# Patient Record
Sex: Male | Born: 1985 | Race: Asian | Hispanic: No | Marital: Married | Smoking: Never smoker
Health system: Southern US, Community
[De-identification: ages and names within clinical notes are randomized; demographics above are authoritative.]

## PROBLEM LIST (undated history)

## (undated) DIAGNOSIS — K219 Gastro-esophageal reflux disease without esophagitis: Secondary | ICD-10-CM

## (undated) DIAGNOSIS — E119 Type 2 diabetes mellitus without complications: Secondary | ICD-10-CM

## (undated) DIAGNOSIS — B1910 Unspecified viral hepatitis B without hepatic coma: Secondary | ICD-10-CM

## (undated) DIAGNOSIS — F32A Depression, unspecified: Secondary | ICD-10-CM

## (undated) DIAGNOSIS — E785 Hyperlipidemia, unspecified: Secondary | ICD-10-CM

## (undated) DIAGNOSIS — N529 Male erectile dysfunction, unspecified: Secondary | ICD-10-CM

## (undated) DIAGNOSIS — G629 Polyneuropathy, unspecified: Secondary | ICD-10-CM

## (undated) HISTORY — DX: Unspecified viral hepatitis B without hepatic coma: B19.10

## (undated) HISTORY — DX: Hyperlipidemia, unspecified: E78.5

## (undated) HISTORY — DX: Polyneuropathy, unspecified: G62.9

## (undated) HISTORY — DX: Depression, unspecified: F32.A

## (undated) HISTORY — DX: Gastro-esophageal reflux disease without esophagitis: K21.9

## (undated) HISTORY — DX: Male erectile dysfunction, unspecified: N52.9

---

## 2010-08-08 ENCOUNTER — Emergency Department (HOSPITAL_COMMUNITY)
Admission: EM | Admit: 2010-08-08 | Discharge: 2010-08-08 | Payer: Self-pay | Source: Home / Self Care | Admitting: Family Medicine

## 2010-08-09 LAB — POCT URINALYSIS DIPSTICK
Bilirubin Urine: NEGATIVE
Nitrite: NEGATIVE
Specific Gravity, Urine: <=1.005 (ref 1.005–1.030)
Urine Glucose, Fasting: 500 mg/dL — AB

## 2010-08-09 LAB — POCT I-STAT, CHEM 8
Chloride: 99 mEq/L (ref 96–112)
Glucose, Bld: 470 mg/dL — ABNORMAL HIGH (ref 70–99)
HCT: 53 % — ABNORMAL HIGH (ref 39.0–52.0)
Potassium: 4.1 mEq/L (ref 3.5–5.1)
Sodium: 135 mEq/L (ref 135–145)

## 2013-01-27 ENCOUNTER — Emergency Department (INDEPENDENT_AMBULATORY_CARE_PROVIDER_SITE_OTHER)
Admission: EM | Admit: 2013-01-27 | Discharge: 2013-01-27 | Disposition: A | Discharge status: Home / Self Care | Payer: Self-pay | Source: Home / Self Care

## 2013-01-27 ENCOUNTER — Encounter (HOSPITAL_COMMUNITY): Payer: Self-pay | Admitting: Emergency Medicine

## 2013-01-27 DIAGNOSIS — E871 Hypo-osmolality and hyponatremia: Secondary | ICD-10-CM

## 2013-01-27 DIAGNOSIS — R7309 Other abnormal glucose: Secondary | ICD-10-CM

## 2013-01-27 DIAGNOSIS — E119 Type 2 diabetes mellitus without complications: Secondary | ICD-10-CM

## 2013-01-27 DIAGNOSIS — E1142 Type 2 diabetes mellitus with diabetic polyneuropathy: Secondary | ICD-10-CM

## 2013-01-27 DIAGNOSIS — R739 Hyperglycemia, unspecified: Secondary | ICD-10-CM

## 2013-01-27 DIAGNOSIS — E1149 Type 2 diabetes mellitus with other diabetic neurological complication: Secondary | ICD-10-CM

## 2013-01-27 HISTORY — DX: Type 2 diabetes mellitus without complications: E11.9

## 2013-01-27 LAB — POCT I-STAT, CHEM 8
BUN: 11 mg/dL (ref 6–23)
Calcium, Ion: 1.2 mmol/L (ref 1.12–1.23)
Chloride: 95 mEq/L — ABNORMAL LOW (ref 96–112)
HCT: 54 % — ABNORMAL HIGH (ref 39.0–52.0)
Potassium: 4.1 mEq/L (ref 3.5–5.1)

## 2013-01-27 NOTE — ED Notes (Signed)
Pt c/o bilateral feet burning onset >1 month... Reports he is a diabetic and has not had DM meds >1 year since [facility] closed... Reports he was taking metformin and another med for DM but does not remember the name... He is alert w/no signs of acute distress.

## 2013-01-27 NOTE — ED Notes (Signed)
Reports he has been managing his DM w/diet and exercise.

## 2013-01-27 NOTE — ED Provider Notes (Signed)
History    CSN: [id number] Arrival date & time 01/27/13  1304  First MD Initiated Contact with Patient 01/27/13 1358     Chief Complaint  Patient presents with  . Foot Burn   (Consider location/radiation/quality/duration/timing/severity/associated sxs/prior Treatment) HPI Comments: 27 year old Hispanic male is a former patient of [facility] who presents with burning type pain in his feet over a month. He also has pain in his thighs and calves. He was diagnosed with diabetes a few years ago and was being treated with a medication from [facility]. He is now complaining of polyuria and polydipsia. He has been out of his diabetes medication for one year.  Past Medical History  Diagnosis Date  . Diabetes mellitus without complication    History reviewed. No pertinent past surgical history. No family history on file. History  Substance Use Topics  . Smoking status: Never Smoker   . Smokeless tobacco: Not on file  . Alcohol Use: No    Review of Systems  Constitutional: Positive for appetite change and fatigue. Negative for fever.  HENT: Negative.   Respiratory: Negative.   Cardiovascular: Negative.   Gastrointestinal: Negative.   Endocrine: Positive for polydipsia and polyuria.  Genitourinary: Negative for dysuria.  Neurological: Negative.     Allergies  Review of patient's allergies indicates no known allergies.  Home Medications  No current outpatient prescriptions on file. BP 134/82  Pulse 74  Temp(Src) 99 F (37.2 C) (Oral)  Resp 18  SpO2 98% Physical Exam  Nursing note and vitals reviewed. Constitutional: He appears well-developed and well-nourished. No distress.  Eyes: Conjunctivae and EOM are normal.  Neck: Normal range of motion. Neck supple.  Cardiovascular: Normal rate and normal heart sounds.   Pulmonary/Chest: Effort normal and breath sounds normal.  Abdominal: Soft. Bowel sounds are normal.  Musculoskeletal: Normal range of motion. He exhibits no  edema and no tenderness.  Lymphadenopathy:    He has no cervical adenopathy.  Neurological: He is alert. He exhibits normal muscle tone.  Skin: Skin is warm. No erythema.    ED Course  Procedures (including critical care time) Labs Reviewed  POCT I-STAT, CHEM 8 - Abnormal; Notable for the following:    Sodium 133 (*)    Chloride 95 (*)    Glucose, Bld 486 (*)    Hemoglobin 18.4 (*)    HCT 54.0 (*)    All other components within normal limits   No results found. 1. T2DM (type 2 diabetes mellitus)   2. Hyperglycemia   3. Dehydration with hyponatremia   4. Diabetic peripheral neuropathy associated with type 2 diabetes mellitus     MDM  Due to the patient's mild hyponatremia and mild dehydration with hyperglycemia and no medications for glucose control the patient will be transferred via shuttle to the emergency department for IVF and insulin. After which, the patient can be referred to the [facility].   Hayden Rasmussen, NP 01/27/13 1521 The patient was advised of his condition in necessity to obtain IV fluids and insulin to lower his blood sugar and at that point a decision can be made for temporary medications for glycemic control. The patient states that he could not go now that he needed to take his child to his mother's house and then he needed to go to a meeting for Medicaid at 4:00. Therefore, the patient elected not to go to the emergency department now in favor of taking care of his business. He states he would go there  later tonight. He was advised that he may get worse in the meantime.  Hayden Rasmussen, NP 01/27/13 [phone]

## 2013-01-27 NOTE — ED Notes (Signed)
Pt reports he could not go to ER now b/c he had to drop off his son and had appt at [facility] to renew his childs medicaid.... Hayden Rasmussen, NP is aware... Adv pt that he needs to to ER today after he finishes but it's very important he do so immediately... Pt verbalized understanding and states he would go around 2200 tonight.

## 2013-01-27 NOTE — ED Provider Notes (Signed)
Medical screening examination/treatment/procedure(s) were performed by resident physician or non-physician practitioner and as supervising physician I was immediately available for consultation/collaboration.   KINDL,JAMES DOUGLAS MD.   James D Kindl, MD 01/27/13 1748 

## 2013-01-28 ENCOUNTER — Encounter (HOSPITAL_COMMUNITY): Payer: Self-pay | Admitting: Emergency Medicine

## 2013-01-28 ENCOUNTER — Emergency Department (HOSPITAL_COMMUNITY)
Admission: EM | Admit: 2013-01-28 | Discharge: 2013-01-28 | Disposition: A | Discharge status: Home / Self Care | Payer: Self-pay | Attending: Emergency Medicine | Admitting: Emergency Medicine

## 2013-01-28 DIAGNOSIS — E860 Dehydration: Secondary | ICD-10-CM | POA: Insufficient documentation

## 2013-01-28 DIAGNOSIS — R7309 Other abnormal glucose: Secondary | ICD-10-CM | POA: Insufficient documentation

## 2013-01-28 DIAGNOSIS — R358 Other polyuria: Secondary | ICD-10-CM | POA: Insufficient documentation

## 2013-01-28 DIAGNOSIS — IMO0001 Reserved for inherently not codable concepts without codable children: Secondary | ICD-10-CM | POA: Insufficient documentation

## 2013-01-28 DIAGNOSIS — R3589 Other polyuria: Secondary | ICD-10-CM | POA: Insufficient documentation

## 2013-01-28 DIAGNOSIS — R631 Polydipsia: Secondary | ICD-10-CM | POA: Insufficient documentation

## 2013-01-28 DIAGNOSIS — R739 Hyperglycemia, unspecified: Secondary | ICD-10-CM

## 2013-01-28 LAB — COMPREHENSIVE METABOLIC PANEL
Alkaline Phosphatase: 85 U/L (ref 39–117)
BUN: 13 mg/dL (ref 6–23)
CO2: 25 mEq/L (ref 19–32)
Chloride: 94 mEq/L — ABNORMAL LOW (ref 96–112)
Creatinine, Ser: 0.52 mg/dL (ref 0.50–1.35)
GFR calc non Af Amer: >90 mL/min (ref >90)
Glucose, Bld: 389 mg/dL — ABNORMAL HIGH (ref 70–99)
Potassium: 3.8 mEq/L (ref 3.5–5.1)
Total Bilirubin: 1 mg/dL (ref 0.3–1.2)

## 2013-01-28 LAB — GLUCOSE, CAPILLARY
Glucose-Capillary: 377 mg/dL — ABNORMAL HIGH (ref 70–99)
Glucose-Capillary: 327 mg/dL — ABNORMAL HIGH (ref 70–99)

## 2013-01-28 LAB — URINALYSIS, ROUTINE W REFLEX MICROSCOPIC
Glucose, UA: >1000 mg/dL — AB
Ketones, ur: NEGATIVE mg/dL
Leukocytes, UA: NEGATIVE
Protein, ur: NEGATIVE mg/dL
Urobilinogen, UA: 0.2 mg/dL (ref 0.0–1.0)

## 2013-01-28 LAB — CBC
HCT: 43.6 % (ref 39.0–52.0)
Hemoglobin: 15.6 g/dL (ref 13.0–17.0)
MCV: 83.5 fL (ref 78.0–100.0)
RBC: 5.22 MIL/uL (ref 4.22–5.81)
WBC: 5.8 10*3/uL (ref 4.0–10.5)

## 2013-01-28 MED ORDER — SODIUM CHLORIDE 0.9 % IV BOLUS (SEPSIS)
1000.0000 mL | Freq: Once | INTRAVENOUS | Status: AC
  Administered 2013-01-28: 1000 mL via INTRAVENOUS

## 2013-01-28 MED ORDER — METFORMIN HCL 1000 MG PO TABS: 1000.0000 mg | ORAL_TABLET | Freq: Two times a day (BID) | ORAL | Status: DC

## 2013-01-28 MED ORDER — GLIPIZIDE 10 MG PO TABS: 5.0000 mg | ORAL_TABLET | Freq: Two times a day (BID) | ORAL | Status: DC

## 2013-01-28 NOTE — ED Notes (Signed)
Dr Manly at bedside 

## 2013-01-28 NOTE — ED Notes (Signed)
PT. SEEN AT  [facility] TODAY , REPORTS ELEVATED BLOOD SUGAR THIS AFTERNOON  = 486 , BLOOD TEST DONE AT URGENT CARE . PT. STATED THIRST , BILATERAL FEET / LOWER LEG TINGLING " BURNING" , PT. STATED HE HAS NO MEDICATIONS FOR HIS DM FOR SEVERAL MONTHS . RESPIRATIONS UNLABORED, DENIES NAUSEA OR VOMITTING .

## 2013-01-28 NOTE — ED Provider Notes (Signed)
History    CSN: [id number] Arrival date & time 01/28/13  0018  First MD Initiated Contact with Patient 01/28/13 [phone]     Chief Complaint  Patient presents with  . Hyperglycemia   (Consider location/radiation/quality/duration/timing/severity/associated sxs/prior Treatment) HPI Patient is a 27 yo man with NIDDM who has been off all meds for a year. He presents on referral from UC where he was found to have a BG in the 400s.   Patient has had polyuria, polydypsia and burning in his feet bilaterally. No vomiting. No visual changes. Says he was receiving care at [facility] but has had no follow up since the office closed. Thus,  He has not been on meds. He had been taking Metformin and "another medication" which he can't recall the name of.   He does not receive primary care.  Past Medical History  Diagnosis Date  . Diabetes mellitus without complication    History reviewed. No pertinent past surgical history. No family history on file. History  Substance Use Topics  . Smoking status: Never Smoker   . Smokeless tobacco: Not on file  . Alcohol Use: Yes    Review of Systems Gen: no weight loss, fevers, chills, night sweats Eyes: no discharge or drainage, no occular pain or visual changes Nose: no epistaxis or rhinorrhea Mouth: no dental pain, no sore throat Neck: no neck pain Lungs: no SOB, cough, wheezing CV: no chest pain, palpitations, dependent edema or orthopnea Abd: no abdominal pain, nausea, vomiting GU: no dysuria or gross hematuria MSK: no myalgias or arthralgias Neuro: no headache, no focal neurologic deficits Skin: no rash Endo: as per hpi, otherwise negative Psyche: negative.  Allergies  Review of patient's allergies indicates no known allergies.  Home Medications  No current outpatient prescriptions on file. BP 109/64  Pulse 64  Temp(Src) 98 F (36.7 C) (Oral)  Resp 18  Ht 5\' 8"  (1.727 m)  Wt 170 lb (77.111 kg)  BMI 25.85 kg/m2  SpO2 99% Physical  Exam Gen: well developed and well nourished appearing Head: NCAT Eyes: PERL, EOMI Nose: no epistaixis or rhinorrhea Mouth/throat: mucosa is mildly dehydrated appearing and pink Neck: supple, no stridor Lungs: CTA B, no wheezing, rhonchi or rales Heart-regular rate and rhythm, no murmur, chemical perfused, cap refill less than 2 seconds Abd: soft, notender, nondistended Back: no ttp, no cva ttp Skin: no rashese, wnl Neuro: CN ii-xii grossly intact, no focal deficits Psyche; normal affect,  calm and cooperative.   ED Course  Procedures (including critical care time) Results for orders placed during the hospital encounter of 01/28/13 (from the past 24 hour(s))  CBC     Status: None   Collection Time    01/28/13  1:30 AM      Result Value Range   WBC 5.8  4.0 - 10.5 K/uL   RBC 5.22  4.22 - 5.81 MIL/uL   Hemoglobin 15.6  13.0 - 17.0 g/dL   HCT 11.9  14.7 - 82.9 %   MCV 83.5  78.0 - 100.0 fL   MCH 29.9  26.0 - 34.0 pg   MCHC 35.8  30.0 - 36.0 g/dL   RDW 56.2  13.0 - 86.5 %   Platelets 160  150 - 400 K/uL  COMPREHENSIVE METABOLIC PANEL     Status: Abnormal   Collection Time    01/28/13  1:30 AM      Result Value Range   Sodium 127 (*) 135 - 145 mEq/L   Potassium 3.8  3.5 -  5.1 mEq/L   Chloride 94 (*) 96 - 112 mEq/L   CO2 25  19 - 32 mEq/L   Glucose, Bld 389 (*) 70 - 99 mg/dL   BUN 13  6 - 23 mg/dL   Creatinine, Ser 1.61  0.50 - 1.35 mg/dL   Calcium 8.9  8.4 - 09.6 mg/dL   Total Protein 6.6  6.0 - 8.3 g/dL   Albumin 3.5  3.5 - 5.2 g/dL   AST 28  0 - 37 U/L   ALT 92 (*) 0 - 53 U/L   Alkaline Phosphatase 85  39 - 117 U/L   Total Bilirubin 1.0  0.3 - 1.2 mg/dL   GFR calc non Af Amer >90  >90 mL/min   GFR calc Af Amer >90  >90 mL/min  GLUCOSE, CAPILLARY     Status: Abnormal   Collection Time    01/28/13  1:51 AM      Result Value Range   Glucose-Capillary 377 (*) 70 - 99 mg/dL     MDM  Patient with uncontrolled NIDDM presents with nonketotic hyperglycemia and  dehydration. We are treating with IVF. Anticipate that the patient will be able to be discharged with plan to restart Metformin and Glipizide. Patient had been followed at [facility] before the clinic closed and will be referred to the [facility].   Brandt Loosen, MD 01/28/13 [phone]

## 2013-01-28 NOTE — ED Notes (Signed)
Pt states he has diabetes and went to urgent care earlier today regarding his blood sugar. Pt states he has been out of medication over a year and his feet have been burning for about 5-6 months. Urgent care sent him here for further evaluation. CBG at urgent care 486. Pt able to wiggle toes, cap refill <3 secs, pedal pulses strong.

## 2013-01-28 NOTE — ED Notes (Signed)
Pt comfortable with d/c and f/u instructions. Prescriptions x2. 

## 2013-10-01 ENCOUNTER — Ambulatory Visit (INDEPENDENT_AMBULATORY_CARE_PROVIDER_SITE_OTHER): Payer: No Typology Code available for payment source | Admitting: Medical

## 2013-10-01 ENCOUNTER — Encounter: Payer: Self-pay | Admitting: Medical

## 2013-10-01 VITALS — BP 118/70 | HR 78 | Temp 97.7°F | Resp 16 | Ht 67.75 in | Wt 166.0 lb

## 2013-10-01 DIAGNOSIS — IMO0001 Reserved for inherently not codable concepts without codable children: Secondary | ICD-10-CM

## 2013-10-01 DIAGNOSIS — R3589 Other polyuria: Secondary | ICD-10-CM

## 2013-10-01 DIAGNOSIS — R631 Polydipsia: Secondary | ICD-10-CM

## 2013-10-01 DIAGNOSIS — R202 Paresthesia of skin: Secondary | ICD-10-CM

## 2013-10-01 DIAGNOSIS — E1165 Type 2 diabetes mellitus with hyperglycemia: Principal | ICD-10-CM

## 2013-10-01 DIAGNOSIS — R209 Unspecified disturbances of skin sensation: Secondary | ICD-10-CM

## 2013-10-01 DIAGNOSIS — R358 Other polyuria: Secondary | ICD-10-CM

## 2013-10-01 LAB — POCT URINALYSIS DIPSTICK
BILIRUBIN UA: NEGATIVE
GLUCOSE UA: 250
KETONES UA: NEGATIVE
Leukocytes, UA: NEGATIVE
Nitrite, UA: NEGATIVE
Protein, UA: NEGATIVE
RBC UA: NEGATIVE
SPEC GRAV UA: 1.015
Urobilinogen, UA: NEGATIVE
pH, UA: 5

## 2013-10-01 LAB — CBC
HCT: 49.6 % (ref 39.0–52.0)
Hemoglobin: 17.3 g/dL — ABNORMAL HIGH (ref 13.0–17.0)
MCH: 30.3 pg (ref 26.0–34.0)
MCHC: 34.9 g/dL (ref 30.0–36.0)
MCV: 86.9 fL (ref 78.0–100.0)
PLATELETS: 181 10*3/uL (ref 150–400)
RBC: 5.71 MIL/uL (ref 4.22–5.81)
RDW: 13.6 % (ref 11.5–15.5)
WBC: 6 10*3/uL (ref 4.0–10.5)

## 2013-10-01 LAB — COMPREHENSIVE METABOLIC PANEL
ALT: 66 U/L — AB (ref 0–53)
AST: 26 U/L (ref 0–37)
Albumin: 4.6 g/dL (ref 3.5–5.2)
Alkaline Phosphatase: 87 U/L (ref 39–117)
BILIRUBIN TOTAL: 0.8 mg/dL (ref 0.2–1.2)
BUN: 10 mg/dL (ref 6–23)
CO2: 26 meq/L (ref 19–32)
CREATININE: 0.68 mg/dL (ref 0.50–1.35)
Calcium: 9.4 mg/dL (ref 8.4–10.5)
Chloride: 96 mEq/L (ref 96–112)
Glucose, Bld: 388 mg/dL — ABNORMAL HIGH (ref 70–99)
Potassium: 4.9 mEq/L (ref 3.5–5.3)
Sodium: 128 mEq/L — ABNORMAL LOW (ref 135–145)
Total Protein: 7.6 g/dL (ref 6.0–8.3)

## 2013-10-01 LAB — LIPID PANEL
CHOL/HDL RATIO: 5.3 ratio
CHOLESTEROL: 203 mg/dL — AB (ref 0–200)
HDL: 38 mg/dL — ABNORMAL LOW (ref >39)
LDL Cholesterol: 110 mg/dL — ABNORMAL HIGH (ref 0–99)
TRIGLYCERIDES: 274 mg/dL — AB (ref <150)
VLDL: 55 mg/dL — AB (ref 0–40)

## 2013-10-01 LAB — HEMOGLOBIN A1C
Hgb A1c MFr Bld: 14.1 % — ABNORMAL HIGH (ref <5.7)
Mean Plasma Glucose: 358 mg/dL — ABNORMAL HIGH (ref <117)

## 2013-10-01 LAB — GLUCOSE, POCT (MANUAL RESULT ENTRY): POC Glucose: 322 mg/dl — AB (ref 70–99)

## 2013-10-01 NOTE — Patient Instructions (Signed)
  Thank you for giving me the opportunity to serve you today.    Your diagnosis today includes: Encounter Diagnoses  Name Primary?  . Type II or unspecified type diabetes mellitus without mention of complication, uncontrolled Yes  . Polydipsia   . Polyuria   . Paresthesia of both feet      Specific recommendations today include:  I am concerned that your sugar numbers are going to be quite high.  We will plan to call you tomorrow with lab results.  I am going to start you back on diabetic medication, but which medicine we use will be determined by your sugar numbers  I recommend you drink plenty of water throughout the day  I recommend you walk or get some type of exercise regularly  I recommend you cut back gradually but significantly on beer. Try and limit yourself to one or less beer per day. However cut down gradually not abruptly  You probably eat fairly healthy, however measure your rice out and eat no more than 1 cup of rice per meal.  This assumes you are not eating other starchy things like bread, potatoes, pasta at each meal  I do recommend you see eye doctor for routine evaluation and screening  I recommend you check your feet every day to look for wounds or sores as these things may not heal quickly being diabetic  I recommend you start checking your blood sugar using the glucometer.  Until we call you back I would plan on checking every morning to see sugar numbers fasting in the morning  I recommended you wear good supportive shoes with thick soles, I recommend you wear diabetic socks  Typically we see diabetics at least every 3 months for routine followup, but until we get your sugar under control, we will need to see you more often   Follow up: pending labs

## 2013-10-01 NOTE — Progress Notes (Signed)
Subjective:   Gregory Campos is a 28 y.o. male presenting on 10/01/2013 with med managment and Alopecia  Here as a new patient.   Originally from [place], came here from [place] as a refugee in 2008.  Has hx/o diabetes, diagnosed age 28yo.  Last medication was 78mo ago, was on glucotrol and metformin, was getting refills from the hospital ED.   Diet - eats a lots of rice, eating brown rice, eats a lot of vegetables, lot of meat too, mostly goat, fresh.  Does drink several beers a week.  Exercise - works all the time.  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  Not checking glucose.  Doesn't have a machine now, had one before.   Lately having polydipsia, polyuria, feet burning and hurting.  No vision changes.  Has fatiuge.  Weight has been the same.  No other aggravating or relieving factors.  No other complaint.  Review of Systems ROS as in subjective      Objective:     Filed Vitals:   10/01/13 0911  BP: 118/70  Pulse: 78  Temp: 97.7 F (36.5 C)  Resp: 16    General appearance: alert, no distress, WD/WN, pleasant Gregory Campos male, speaks good english Oral cavity: MMM, no lesions Neck: supple, no lymphadenopathy, no thyromegaly, no masses Heart: RRR, normal S1, S2, no murmurs Lungs: CTA bilaterally, no wheezes, rhonchi, or rales Abdomen: +bs, soft, non tender, non distended, no masses, no hepatomegaly, no splenomegaly Pulses: 2+ symmetric, upper and lower extremities, normal cap refill Ext: no edema See separate foot exam     Assessment: Encounter Diagnoses  Name Primary?  . Type II or unspecified type diabetes mellitus without mention of complication, uncontrolled Yes  . Polydipsia   . Polyuria   . Paresthesia of both feet      Plan: We discussed his diagnosis of diabetes, his current symptoms, followup, routine recommendations .  I will start him back on metformin, but pending labs he'll likely need to be on other medications.  I had nurse give him  sample glucometer and go over proper use.  Specific recommendations today include:  I am concerned that your sugar numbers are going to be quite high.  We will plan to call you tomorrow with lab results.  I am going to start you back on diabetic medication, but which medicine we use will be determined by your sugar numbers  I recommend you drink plenty of water throughout the day  I recommend you walk or get some type of exercise regularly  I recommend you cut back gradually but significantly on beer. Try and limit yourself to one or less beer per day. However cut down gradually not abruptly  You probably eat fairly healthy, however measure your rice out and eat no more than 1 cup of rice per meal.  This assumes you are not eating other starchy things like bread, potatoes, pasta at each meal  I do recommend you see eye doctor for routine evaluation and screening  I recommend you check your feet every day to look for wounds or sores as these things may not heal quickly being diabetic  I recommend you start checking your blood sugar using the glucometer.  Until we call you back I would plan on checking every morning to see sugar numbers fasting in the morning  I recommended you wear good supportive shoes with thick soles, I recommend you wear diabetic socks  Typically we see diabetics at least  every 3 months for routine followup, but until we get your sugar under control, we will need to see you more often   [name] was seen today for med managment and alopecia.  Diagnoses and associated orders for this visit:  Type II or unspecified type diabetes mellitus without mention of complication, uncontrolled - Comprehensive metabolic panel - Lipid panel - CBC - Microalbumin / creatinine urine ratio - Hemoglobin A1c - [facility] DIABETES FOOT EXAM  Polydipsia - Comprehensive metabolic panel - Lipid panel - CBC - Microalbumin / creatinine urine ratio - Hemoglobin A1c - [facility] DIABETES FOOT  EXAM  Polyuria - Comprehensive metabolic panel - Lipid panel - CBC - Microalbumin / creatinine urine ratio - Hemoglobin A1c - [facility] DIABETES FOOT EXAM  Paresthesia of both feet - Comprehensive metabolic panel - Lipid panel - CBC - Microalbumin / creatinine urine ratio - Hemoglobin A1c - [facility] DIABETES FOOT EXAM    Return pending labs.

## 2013-10-01 NOTE — Addendum Note (Signed)
Addended by: Lilli LightLOMAX, WENDY G on: 10/01/2013 12:44 PM   Modules accepted: Orders

## 2013-10-02 ENCOUNTER — Encounter: Payer: Self-pay | Admitting: Medical

## 2013-10-02 ENCOUNTER — Ambulatory Visit (INDEPENDENT_AMBULATORY_CARE_PROVIDER_SITE_OTHER): Payer: No Typology Code available for payment source | Admitting: Medical

## 2013-10-02 DIAGNOSIS — R631 Polydipsia: Secondary | ICD-10-CM

## 2013-10-02 DIAGNOSIS — E871 Hypo-osmolality and hyponatremia: Secondary | ICD-10-CM

## 2013-10-02 DIAGNOSIS — E1165 Type 2 diabetes mellitus with hyperglycemia: Principal | ICD-10-CM

## 2013-10-02 DIAGNOSIS — IMO0001 Reserved for inherently not codable concepts without codable children: Secondary | ICD-10-CM | POA: Insufficient documentation

## 2013-10-02 DIAGNOSIS — R358 Other polyuria: Secondary | ICD-10-CM

## 2013-10-02 DIAGNOSIS — E782 Mixed hyperlipidemia: Secondary | ICD-10-CM | POA: Insufficient documentation

## 2013-10-02 DIAGNOSIS — R3589 Other polyuria: Secondary | ICD-10-CM

## 2013-10-02 DIAGNOSIS — R748 Abnormal levels of other serum enzymes: Secondary | ICD-10-CM

## 2013-10-02 LAB — MICROALBUMIN / CREATININE URINE RATIO
Creatinine, Urine: 21.6 mg/dL
MICROALB UR: 0.51 mg/dL (ref 0.00–1.89)
Microalb Creat Ratio: 23.6 mg/g (ref 0.0–30.0)

## 2013-10-02 MED ORDER — INSULIN ASPART 100 UNIT/ML FLEXPEN: 10.0000 [IU] | PEN_INJECTOR | Freq: Three times a day (TID) | SUBCUTANEOUS | Status: DC

## 2013-10-02 MED ORDER — INSULIN DETEMIR 100 UNIT/ML FLEXPEN: 10.0000 [IU] | PEN_INJECTOR | Freq: Every day | SUBCUTANEOUS | Status: DC

## 2013-10-02 MED ORDER — METFORMIN HCL 1000 MG PO TABS: ORAL_TABLET | ORAL | Status: DC

## 2013-10-02 NOTE — Patient Instructions (Signed)
Thank you for giving me the opportunity to serve you today.    Your diagnosis today includes: Encounter Diagnoses  Name Primary?  . Type II or unspecified type diabetes mellitus without mention of complication, uncontrolled Yes  . Mixed dyslipidemia   . Hyponatremia   . Polyuria   . Polydipsia   . Elevated liver enzymes      Specific recommendations today include:  I want you to check your sugars before breakfast, lunch, dinner, and at bedtime and write these down in a notebook or a log sheet  I want you to begin NovoLog flexpen 10 units with each meal 3 times a day.  This is a fast acting meal time insulin  I also want you to begin Levemir flex touch pen and 10 units at bedtime daily.  This is a long acting basal insulin  As we discussed yesterday, start cutting down on your beer intake  I want you to measure out portions of breads, rice, potatoes, or any starchy carbohydrates  I would like to refer you to a nutritionist to help you make sure you're eating the right portions of carbohydrates  For the next 2-3 days try to drink 16-20 ounces of water every hour until your urine is clear  Follow up: in 1 week  Diabetes Meal Planning Guide The diabetes meal planning guide is a tool to help you plan your meals and snacks. It is important for people with diabetes to manage their blood glucose (sugar) levels. Choosing the right foods and the right amounts throughout your day will help control your blood glucose. Eating right can even help you improve your blood pressure and reach or maintain a healthy weight.  CARBOHYDRATE COUNTING MADE EASY When you eat carbohydrates, they turn to sugar. This raises your blood glucose level. Counting carbohydrates can help you control this level so you feel better. When you plan your meals by counting carbohydrates, you can have more flexibility in what you eat and balance your medicine with your food intake.  Carbohydrate counting simply means  adding up the total amount of carbohydrate grams in your meals and snacks. Try to eat about the same amount at each meal. Foods with carbohydrates are listed below.   Each portion below is 1 carbohydrate serving or 15 grams of carbohydrates.    1800 Calorie Diet for Diabetes Meal Planning The 1800 calorie diet is designed for eating up to 1800 calories each day. Following this diet and making healthy meal choices can help improve overall health. This diet controls blood sugar (glucose) levels and can also help lower blood pressure and cholesterol.  SERVING SIZES Measuring foods and serving sizes helps to make sure you are getting the right amount of food. The list below tells how big or small some common serving sizes are:  1 oz.........4 stacked dice.  3 oz........Marland KitchenDeck of cards.  1 tsp.......Marland KitchenTip of little finger.  1 tbs......Marland KitchenMarland KitchenThumb.  2 tbs.......Marland KitchenGolf ball.   cup......Marland KitchenHalf of a fist.  1 cup.......Marland KitchenA fist.   GUIDELINES FOR CHOOSING FOODS The goal of this diet is to eat a variety of foods and limit calories to 1800 each day. This can be done by choosing foods that are low in calories and fat. The diet also suggests eating small amounts of food frequently. Doing this helps control your blood glucose levels so they do not get too high or too low. Each meal or snack may include a protein food source to help you feel more satisfied and to  stabilize your blood glucose. Try to eat about the same amount of food around the same time each day. This includes weekend days, travel days, and days off work. Space your meals about 4 to 5 hours apart and add a snack between them if you wish.  For example, a daily food plan could include breakfast, a morning snack, lunch, dinner, and an evening snack. Healthy meals and snacks include whole grains, vegetables, fruits, lean meats, poultry, fish, and dairy products. As you plan your meals, select a variety of foods. Choose from the bread and starch,  vegetable, fruit, dairy, and meat/protein groups. Examples of foods from each group and their suggested serving sizes are listed below. Use measuring cups and spoons to become familiar with what a healthy portion looks like. Bread and Starch Each serving equals 15 grams of carbohydrates.  1 slice bread.   bagel.   cup cold cereal (unsweetened).   cup hot cereal or mashed potatoes.  1 small potato (size of a computer mouse).   cup cooked pasta or rice.   English muffin.  1 cup broth-based soup.  3 cups of popcorn.  4 to 6 whole-wheat crackers.   cup cooked beans, peas, or corn. Vegetable Each serving equals 5 grams of carbohydrates.   cup cooked vegetables.  1 cup raw vegetables.   cup tomato or vegetable juice. Fruit Each serving equals 15 grams of carbohydrates.  1 small apple or orange.  1 cup watermelon or strawberries.   cup applesauce (no sugar added).  2 tbs raisins.   banana.   cup canned fruit, packed in water, its own juice, or sweetened with a sugar substitute.   cup unsweetened fruit juice. Dairy Each serving equals 12 to 15 grams of carbohydrates.  1 cup fat-free milk.  6 oz artificially sweetened yogurt or plain yogurt.  1 cup low-fat buttermilk.  1 cup soy milk.  1 cup almond milk. Meat/Protein  1 large egg.  2 to 3 oz meat, poultry, or fish.   cup low-fat cottage cheese.  1 tbs peanut butter.  1 oz low-fat cheese.   cup tuna in water.   cup tofu. Fat  1 tsp oil.  1 tsp trans-fat-free margarine.  1 tsp butter.  1 tsp mayonnaise.  2 tbs avocado.  1 tbs salad dressing.  1 tbs cream cheese.  2 tbs sour cream.  SAMPLE 1800 CALORIE DIET PLAN Breakfast   cup unsweetened cereal (1 carb serving).  1 cup fat-free milk (1 carb serving).  1 slice whole-wheat toast (1 carb serving).   small banana (1 carb serving).  1 scrambled egg.  1 tsp trans-fat-free margarine. Lunch  Tuna  sandwich.  2 slices whole-wheat bread (2 carb servings).   cup canned tuna in water, drained.  1 tbs reduced fat mayonnaise.  1 stalk celery, chopped.  2 slices tomato.  1 lettuce leaf.  1 cup carrot sticks.  24 to 30 seedless grapes (2 carb servings).  6 oz light yogurt (1 carb serving). Afternoon Snack  3 graham cracker squares (1 carb serving).  Fat-free milk, 1 cup (1 carb serving).  1 tbs peanut butter. Dinner  3 oz salmon, broiled with 1 tsp oil.  1 cup mashed potatoes (2 carb servings) with 1 tsp trans-fat-free margarine.  1 cup fresh or frozen green beans.  1 cup steamed asparagus.  1 cup fat-free milk (1 carb serving). Evening Snack  3 cups air-popped popcorn (1 carb serving).  2 tbs parmesan cheese sprinkled on  top.  MEAL PLAN Use this worksheet to help you make a daily meal plan based on the 1800 calorie diet suggestions. If you are using this plan to help you control your blood glucose, you may interchange carbohydrate-containing foods (dairy, starches, and fruits). Select a variety of fresh foods of varying colors and flavors. The total amount of carbohydrate in your meals or snacks is more important than making sure you include all of the food groups every time you eat. Choose from the following foods to build your day's meals:  8 Starches.  4 Vegetables.  3 Fruits.  2 Dairy.  6 to 7 oz Meat/Protein.  Up to 4 Fats.  Your dietician can use this worksheet to help you decide how many servings and which types of foods are right for you. BREAKFAST Food Group and Servings / Food Choice Starch ________________________________________________________ Dairy _________________________________________________________ Fruit _________________________________________________________ Meat/Protein __________________________________________________ Fat ___________________________________________________________ LUNCH Food Group and Servings / Food  Choice Starch ________________________________________________________ Meat/Protein __________________________________________________ Vegetable _____________________________________________________ Fruit _________________________________________________________ Dairy _________________________________________________________ Fat ___________________________________________________________ Gregory Campos Food Group and Servings / Food Choice Starch ________________________________________________________ Meat/Protein __________________________________________________ Fruit __________________________________________________________ Dairy _________________________________________________________ Gregory Campos Food Group and Servings / Food Choice Starch _________________________________________________________ Meat/Protein ___________________________________________________ Dairy __________________________________________________________ Vegetable ______________________________________________________ Fruit ___________________________________________________________ Fat ____________________________________________________________ Gregory Campos Food Group and Servings / Food Choice Fruit __________________________________________________________ Meat/Protein ___________________________________________________ Dairy __________________________________________________________ Starch _________________________________________________________ DAILY TOTALS Starch ____________________________ Vegetable _________________________ Fruit _____________________________ Dairy _____________________________ Meat/Protein______________________ Fat _______________________________ Document Released: 01/22/2005 Document Revised: 09/24/2011 Document Reviewed: 05/18/2011 [employer] Patient Information 2013 [place], [place].   Type 2 Diabetes  Diabetes is a long-lasting (chronic) disease.  With diabetes, either the  pancreas does not make enough of a hormone called insulin, or the body has trouble using the insulin that is made.  Over time, diabetes can damage the eyes, kidneys, and nerves causing retinopathy, nephropathy, and neuropathy.  Diabetes puts you at risk for heart disease and peripheral vascular disease which can lead to heart attack, stroke, foot ulcers, and amputations.    Our goal and hopefully your goal is to manage your diabetes in such a way to slow the progression of the disease and do all we can to keep you healthy  Home Care:   Eat healthy, exercise regularly, limit alcohol, and don't smoke!  Check your blood sugar (glucose) once a day before breakfast, or as indicated by our discussion today.  Take your medications daily, don't run out of medications.  Learn about low blood sugar (hypoglycemia). Know how to treat it.  Wear a necklace or bracelet that says you have diabetes.  Check your feet every night for cuts, sores, blisters, and redness. Tell your medical provider if you have problems.  Maintain a normal body weight, or normal BMI - height to weight ratio of 20-25.  Ask me about this.  GET HELP RIGHT AWAY IF:  You have trouble keeping your blood sugar in target range.  You have problems with your medicines.  You are sick and not getting better after 24 hours.  You have a sore or wound that is not healing.  You have vision problems or changes.  You have a fever.  Diet: diabetic diet  Exercise regularly since it has beneficial effects on the heart and blood sugars. Exercising at least three times per week or 150 minutes per week can be as important as medication to a diabetic.  Find some form of exercise that you will enjoy doing regularly.  This can include walking, biking, kayaking, golfing, swimming, dance, aerobics, hiking, etc.  If you have joint problems, many local gyms have equipment to accommodate people with specific needs.    Vaccinations:  Diabetics are  at increased risk for infection, and illnesses can take longer to resolve.  Current vaccine recommendations include yearly Influenza (flu) vaccine (recommended in October), Pneumococcal vaccine, Hepatitis B vaccine series, Tdap (tetanus, diptheria and pertussis) vaccine every 10 years, and other age appropriate vaccinations.     Office visits:  We recommend routine medical care to make sure we are addressing prevention and issues as they arise.  Typically this could mean twice yearly or up to quarterly depending upon your unique health situation.  Exams should include a yearly physical, a yearly foot exam, and other examination as appropriate.  You should see an eye doctor yearly to help screen for and prevent blood vessel complications in your eyes.  Labs: Diabetics should have blood work done at least twice yearly to monitor your Hemoglobin A1C (a three-month average of your blood sugars) and your cholesterol.  You should have your urine and blood checked yearly to screen for kidney damage.  This may include creatinine and micro-albumin levels.  Other labs as appropriate.    Blood pressure goals:  Goal blood pressure in diabetics should be 130/80 or less. Monitoring your blood pressure with a home blood pressure cuff of your own is an excellent idea.  If you are prescribed medication for blood pressure, take your medication every day, and don't run out of medication.  Having high blood pressure can damage your heart, eyes, kidneys, and put you at risk for heart attack and stroke.  Tobacco use:  If you smoke, dip or chew, quitting will reduce your risk of heart attack, stroke, peripheral vascular disease, and many cancers.

## 2013-10-02 NOTE — Progress Notes (Signed)
Pt was called and an appt was made/pt coming in today at 9.00

## 2013-10-02 NOTE — Addendum Note (Signed)
Addended by: Janeice RobinsonSCALES, Zsofia Prout L on: 10/02/2013 09:25 AM   Modules accepted: Orders

## 2013-10-02 NOTE — Progress Notes (Signed)
Subjective:    Gregory Campos is a 28 y.o. male who presents for follow-up of Type 2 diabetes mellitus.    I saw him as a new patient yesterday to establish care and to followup on diabetes type 2. Yesterday he noted that he has been having symptoms of polyuria, polydipsia, has not been on medication for the last 6 months which was metformin and glipizide. I asked him to come in today as his hemoglobin A1c was over 14%, glucose almost 400, sodium was low.   ROS as in subjective above    Objective:   There were no vitals taken for this visit.  General appearance: alert, no distress, WD/WN Oral: MMM, no lesions Heart: RRR, normal S1, S2, no murmurs Lungs: CTA bilaterally, no wheezes, rhonchi, or rales Abdomen: +bs, soft, non tender, non distended, no masses, no hepatomegaly, no splenomegaly Pulses: 2+ symmetric, upper and lower extremities, normal cap refill Ext: no edema  Lab Review Lab Results  Component Value Date   HGBA1C 14.1* 10/01/2013   Lab Results  Component Value Date   CHOL 203* 10/01/2013   HDL 38* 10/01/2013   LDLCALC 110* 10/01/2013   TRIG 274* 10/01/2013   CHOLHDL 5.3 10/01/2013   Lab Results  Component Value Date   MICROALBUR 0.51 10/01/2013     Chemistry      Component Value Date/Time   NA 128* 10/01/2013 0947   K 4.9 10/01/2013 0947   CL 96 10/01/2013 0947   CO2 26 10/01/2013 0947   BUN 10 10/01/2013 0947   CREATININE 0.68 10/01/2013 0947   CREATININE 0.52 01/28/2013 0130      Component Value Date/Time   CALCIUM 9.4 10/01/2013 0947   ALKPHOS 87 10/01/2013 0947   AST 26 10/01/2013 0947   ALT 66* 10/01/2013 0947   BILITOT 0.8 10/01/2013 0947        Chemistry      Component Value Date/Time   NA 128* 10/01/2013 0947   K 4.9 10/01/2013 0947   CL 96 10/01/2013 0947   CO2 26 10/01/2013 0947   BUN 10 10/01/2013 0947   CREATININE 0.68 10/01/2013 0947   CREATININE 0.52 01/28/2013 0130      Component Value Date/Time   CALCIUM 9.4 10/01/2013 0947   ALKPHOS 87  10/01/2013 0947   AST 26 10/01/2013 0947   ALT 66* 10/01/2013 0947   BILITOT 0.8 10/01/2013 0947      Assessment:   Encounter Diagnoses  Name Primary?  . Type II or unspecified type diabetes mellitus without mention of complication, uncontrolled Yes  . Mixed dyslipidemia   . Hyponatremia   . Polyuria   . Polydipsia   . Elevated liver enzymes       Plan:   We discussed his abnormal labs, toxic sugar levels, complications of diabetes, treatment goals.  I had nurse demonstrate proper use of NovoLog flex pen and Levemir flex touch pen.  Specific recommendations today include:  I want you to check your sugars before breakfast, lunch, dinner, and at bedtime and write these down in a notebook or a log sheet  I want you to begin NovoLog flexpen 10 units with each meal 3 times a day.  This is a fast acting meal time insulin  I also want you to begin Levemir flex touch pen and 10 units at bedtime daily.  This is a long acting basal insulin  As we discussed yesterday, start cutting down on your beer intake  I want you to measure out  portions of breads, rice, potatoes, or any starchy carbohydrates  I would like to refer you to a nutritionist to help you make sure you're eating the right portions of carbohydrates  For the next 2-3 days try to drink 16-20 ounces of water every hour until your urine is clear  Begin back on Metformin twice daily  He seems to understand the instructions and plan and f/u  Follow up: in 1 week

## 2013-10-09 ENCOUNTER — Ambulatory Visit (INDEPENDENT_AMBULATORY_CARE_PROVIDER_SITE_OTHER): Payer: No Typology Code available for payment source | Admitting: Medical

## 2013-10-09 ENCOUNTER — Encounter: Payer: Self-pay | Admitting: Medical

## 2013-10-09 VITALS — BP 120/80 | HR 64 | Temp 98.1°F | Resp 16 | Wt 169.0 lb

## 2013-10-09 DIAGNOSIS — R3589 Other polyuria: Secondary | ICD-10-CM

## 2013-10-09 DIAGNOSIS — R631 Polydipsia: Secondary | ICD-10-CM

## 2013-10-09 DIAGNOSIS — E1165 Type 2 diabetes mellitus with hyperglycemia: Principal | ICD-10-CM

## 2013-10-09 DIAGNOSIS — IMO0001 Reserved for inherently not codable concepts without codable children: Secondary | ICD-10-CM

## 2013-10-09 DIAGNOSIS — R358 Other polyuria: Secondary | ICD-10-CM

## 2013-10-09 NOTE — Patient Instructions (Signed)
  Thank you for giving me the opportunity to serve you today.    Your diagnosis today includes: Encounter Diagnoses  Name Primary?  . Type II or unspecified type diabetes mellitus without mention of complication, uncontrolled Yes  . Polydipsia   . Polyuria    You seem to be doing a good job with the insulin and checking your sugar  Specific recommendations today include:  Increase Levemir flex touch pen to 15 units at bedtime daily. This is the long-acting basal insulin  Increase mealtime NovoLog flex pen to 12 units three times daily with meals  Continue metformin 1000 mg twice daily  Continue healthy diabetic diet  Continue checking your blood sugars, however to save money you can check fasting in the morning and either before dinner or before bedtime (twice daily)  Keep a closer watch on the fasting morning sugars as these numbers were lower  I don't want to see numbers under 70, so let me know if you are seeing low numbers  Follow up: 1 month

## 2013-10-09 NOTE — Progress Notes (Signed)
   Subjective:   Gregory Campos is a 28 y.o. male presenting on 10/09/2013 with Follow-up and Diabetes  He is here today for one-week followup on diabetes.  He has his one-week glucose log with him, morning numbers fasting range 145-190. Before lunch his numbers are ranging from 200-300.  His pre-dinner numbers are about the same as lunch.  He has a few 2 hour postprandial numbers in the 400-500 range.  Now that he is started back on the medications including 10 units of NovoLog with meals 3 times a day, Levemir 10 units at bedtime, metformin 1000 twice daily, he has had no more polydipsia or polyuria. He seems to be comfortable with the regimen.  His friend who is also my patient who is diabetic has been giving him some advice as well. No other complaint.  Review of Systems ROS as in subjective      Objective:     Filed Vitals:   10/09/13 0851  BP: 120/80  Pulse: 64  Temp: 98.1 F (36.7 C)  Resp: 16    General appearance: alert, no distress, WD/WN     Assessment: Encounter Diagnoses  Name Primary?  . Type II or unspecified type diabetes mellitus without mention of complication, uncontrolled Yes  . Polydipsia   . Polyuria      Plan: He is handling the new medication regimen well, seems to be comfortable with the injections, which checking his sugars, working on his diet improvement. He is not cleaning the area before he injects, not sure how this got missed in the instructions last week.  We discussed hygiene, alternating sites of injections, cleaning before picking his fingers well.  Specific recommendations today include:  Increase Levemir flex touch pen to 15 units at bedtime daily. This is the long-acting basal insulin  Increase mealtime NovoLog flex pen to 12 units three times daily with meals  Continue metformin 1000 mg twice daily  Continue healthy diabetic diet  Continue checking your blood sugars, however to save money you can check fasting in the  morning and either before dinner or before bedtime (twice daily)  Keep a closer watch on the fasting morning sugars as these numbers were lower  I don't want to see numbers under 70, so let me know if you are seeing low numbers  [name] was seen today for follow-up and diabetes.  Diagnoses and associated orders for this visit:  Type II or unspecified type diabetes mellitus without mention of complication, uncontrolled  Polydipsia  Polyuria    Return in about 1 month (around 11/09/2013).

## 2013-11-09 ENCOUNTER — Encounter: Payer: Self-pay | Admitting: Medical

## 2013-11-09 ENCOUNTER — Ambulatory Visit (INDEPENDENT_AMBULATORY_CARE_PROVIDER_SITE_OTHER): Payer: No Typology Code available for payment source | Admitting: Medical

## 2013-11-09 VITALS — BP 102/80 | HR 80 | Temp 98.2°F | Resp 14 | Wt 170.0 lb

## 2013-11-09 DIAGNOSIS — E1165 Type 2 diabetes mellitus with hyperglycemia: Principal | ICD-10-CM

## 2013-11-09 DIAGNOSIS — IMO0001 Reserved for inherently not codable concepts without codable children: Secondary | ICD-10-CM

## 2013-11-09 LAB — POCT GLYCOSYLATED HEMOGLOBIN (HGB A1C): HEMOGLOBIN A1C: 13.4

## 2013-11-09 MED ORDER — INSULIN DETEMIR 100 UNIT/ML FLEXPEN: PEN_INJECTOR | SUBCUTANEOUS | Status: DC

## 2013-11-09 MED ORDER — SAXAGLIPTIN-METFORMIN ER 5-1000 MG PO TB24: 1.0000 | ORAL_TABLET | Freq: Two times a day (BID) | ORAL | Status: DC

## 2013-11-09 MED ORDER — INSULIN ASPART 100 UNIT/ML FLEXPEN: 10.0000 [IU] | PEN_INJECTOR | Freq: Three times a day (TID) | SUBCUTANEOUS | Status: DC

## 2013-11-09 NOTE — Progress Notes (Signed)
Subjective:   Gregory Campos is an 28 y.o. male who presents for follow up of Type 2 diabetes mellitus.   Patient is sometimes checking home blood sugars, however he had to go to [place] to [place] mothers [place] and forgot his meterand forgot his meter.   He also had to fast part of that time.  Mother passed in her 3580s from diabetes complications. Current symptoms include: FEELS LIKE HIS FEET IS BURNING. Patient denies NONE.  Patient is checking their feet daily. Foot concerns (callous, ulcer, wound, thickened nails, toenail fungus, skin fungus, hammer toe): NONE Last dilated eye exam ?  Current treatments: CURRENT MEDICATIONS METFORMIN AND INSULIN. Medication compliance: fair  Current diet: PATIENT IS ON A DIET Current exercise: HE GOES TO THE GYM A FEW TIMES A WEEK Known diabetic complications: FEELS LIKE HIS FEET IS BURNING   The following portions of the patient's history were reviewed and updated as appropriate: allergies, current medications, past family history, past medical history, past social history, past surgical history and problem list.  ROS as in subjective above    Objective:   Gen: Wd, Wn, Nad Heart: RRR, normal S1, S2, no murmu Lungs clear   Assessment:   Encounter Diagnosis  Name Primary?  . Type II or unspecified type diabetes mellitus without mention of complication, uncontrolled Yes     Plan:    Expressed my sympathies given his mothers recent   Diabetes -Not at goal  Recommendations:  Stop metformin and change to Kombiglyze XR 11/998 mg, 1 tablet twice daily  Change Levemir to 25 units at bedtime daily  Continue meal time Novolog but use sliding scale below  Check glucose in the morning and before meals and get me glucose readings in 1-2 weeks  We will still need to make changes to your sugars, but we will do this carefully  I want to avoid hypoglycemia/low sugar   Correction Insulin/Sliding Scale Your caregiver has  decided you need insulin at home. You have been given a correctional scale (sliding scale) in case you need extra insulin when your blood sugar is too high (hyperglycemia). The following instructions will assist you in how to use that correctional scale.  WHAT IS A CORRECTIONAL SCALE (SLIDING SCALE)?  When you check your blood sugar, sometimes it will be higher than your caregiver wants it to be. You may need an extra dose of insulin to bring your blood sugar to your desired level (also known as your goal, target level, or normal level.) The correctional scale is prescribed by your caregiver based on your specific needs.   ______________________________________________________________________  INSULIN SLIDING SCALE   Use the chart below to determine the amount of your Novolog Insulin that you will use to control your meal time blood sugar.  If your glucose before meal is less than 60, drink 4 oz of orange juice or if able, eat a piece of candy and do not use the meal time dose of insulin  If your glucose before meal is 60 -100, don't use the meal time insulin for this meal If your glucose before meal is 101-150, use  8  units of Insulin  If your glucose before meal is 151-200, use  10  units of Insulin  If your glucose before meal is 201-250, use  14  units of Insulin If your glucose before meal is 251-300, use  16  units of Insulin If your glucose before meal is 301-350, use  20  units of Insulin  If your glucose before meal is 351-400, use  22  units of Insulin If your glucose before meal is 451-500, use  25  units of Insulin If your glucose before meal is >500, use 28 units of Insulin and call doctor immediately  Recheck in 2wk with readings, 44mo f/u in general

## 2013-11-09 NOTE — Patient Instructions (Signed)
Diabetes  Your sugar numbers aren't quite where we need them.  Recommendations:  Stop metformin and change to Kombiglyze XR 11/998 mg, 1 tablet twice daily  Change Levemir to 25 units at bedtime daily  Continue meal time Novolog but use sliding scale below  Check glucose in the morning and before meals and get me glucose readings in 1-2 weeks  We will still need to make changes to your sugars, but we will do this carefully  I want to avoid hypoglycemia/low sugar   Correction Insulin/Sliding Scale Your caregiver has decided you need insulin at home. You have been given a correctional scale (sliding scale) in case you need extra insulin when your blood sugar is too high (hyperglycemia). The following instructions will assist you in how to use that correctional scale.  WHAT IS A CORRECTIONAL SCALE (SLIDING SCALE)?  When you check your blood sugar, sometimes it will be higher than your caregiver wants it to be. You may need an extra dose of insulin to bring your blood sugar to your desired level (also known as your goal, target level, or normal level.) The correctional scale is prescribed by your caregiver based on your specific needs.   ______________________________________________________________________  INSULIN SLIDING SCALE   Use the chart below to determine the amount of your Novolog Insulin that you will use to control your meal time blood sugar.  If your glucose before meal is less than 60, drink 4 oz of orange juice or if able, eat a piece of candy and do not use the meal time dose of insulin  If your glucose before meal is 60 -100, don't use the meal time insulin for this meal If your glucose before meal is 101-150, use  8  units of Insulin  If your glucose before meal is 151-200, use  10  units of Insulin  If your glucose before meal is 201-250, use  14  units of Insulin If your glucose before meal is 251-300, use  16  units of Insulin If your glucose before meal is  301-350, use  20  units of Insulin If your glucose before meal is 351-400, use  22  units of Insulin If your glucose before meal is 451-500, use  25  units of Insulin If your glucose before meal is >500, use 28 units of Insulin and call doctor immediately  ________________________________________________________________________    WHY IS IT IMPORTANT TO KEEP YOUR BLOOD SUGAR LEVELS AT YOUR DESIRED LEVEL?  It helps to prevent long-term complications of diabetes, such as eye disease, kidney failure, and other serious complications. WHAT TYPE OF INSULIN WILL YOU USE?  To help bring down blood sugars that are too high, your caregiver has prescribed a short-acting or a rapid-acting insulin. An example of a short-acting insulin would be Regular.  WHAT DO I NEED TO DO?   Check your blood sugar with your home blood glucose meter as recommended by your caregiver.  Using your correctional scale, find the range your blood sugar lies in.  Look for the units of insulin that matches the blood sugar range. Give yourself the dose of correctional insulin your caregiver has prescribed. Always make sure you are using the right type of insulin.  Prior to the injection make sure you have food available that you can eat in the next 15 to 30 minutes.  If your correctional insulin is rapid acting, start eating your meal within 15 minutes after you have given yourself the insulin injection. If you wait  longer than 15 minutes to eat, your blood sugar might get too low.  If your correctional insulin is short acting (Regular), start eating your meal within 30 minutes after you have given yourself the insulin injection. If you wait longer than 30 minutes to eat, your blood sugar might get too low. Symptoms of low blood sugar (hypoglycemia) may include feeling shaky or weak, sweating a lot, not thinking straight, difficulty seeing, agitation, or crankiness. Check your blood sugar immediately and treat your results  as directed by your caregiver.  Keep a log of your blood sugar results with the time you took the test and the amount of insulin that you injected. This information will help your caregiver manage your medications.  Note on your log anything that may affect your blood sugars such as:  Changes in normal exercise or activity.  Changes in your normal schedule, such as staying up late, going on vacation, changing your diet, or holidays.  New medications. This includes all medications. Some medications, even those that do not require a prescription, may cause high blood sugars.  Illness or stress.  Changes in when you actually took your medication.  Changes in your meals, such as skipping a meal, a late meal, or dining out.  Eating things that may affect blood glucose, such as snacks, larger meal portions than normal, or drinks with sugar.  Ask your caregiver any questions you have.    Hypoglycemia (Low Blood Sugar) Hypoglycemia is when the glucose (sugar) in your blood is too low. Hypoglycemia can happen for many reasons. It can happen to people with or without diabetes. Hypoglycemia can develop quickly and can be a medical emergency.  CAUSES  Having hypoglycemia does not mean that you will develop diabetes. Different causes include:  Missed or delayed meals or not enough carbohydrates eaten.  Medication overdose. This could be by accident or deliberate. If by accident, your medication may need to be adjusted or changed.  Exercise or increased activity without adjustments in carbohydrates or medications.  A nerve disorder that affects body functions like your heart rate, blood pressure and digestion (autonomic neuropathy).  A condition where the stomach muscles do not function properly (gastroparesis). Therefore, medications may not absorb properly.  The inability to recognize the signs of hypoglycemia (hypoglycemic unawareness).  Absorption of insulin  may be  altered.  Alcohol consumption.  Pregnancy/menstrual cycles/postpartum. This may be due to hormones.  Certain kinds of tumors. This is very rare. SYMPTOMS   Sweating.  Hunger.  Dizziness.  Blurred vision.  Drowsiness.  Weakness.  Headache.  Rapid heart beat.  Shakiness.  Nervousness. DIAGNOSIS  Diagnosis is made by monitoring blood glucose in one or all of the following ways:  Fingerstick blood glucose monitoring.  Laboratory results. TREATMENT  If you think your blood glucose is low:  Check your blood glucose, if possible. If it is less than 70 mg/dl, take one of the following:  3-4 glucose tablets.   cup juice (prefer clear like apple).   cup "regular" soda pop.  1 cup milk.  -1 tube of glucose gel.  5-6 hard candies.  Do not over treat because your blood glucose (sugar) will only go too high.  Wait 15 minutes and recheck your blood glucose. If it is still less than 70 mg/dl (or below your target range), repeat treatment.  Eat a snack if it is more than one hour until your next meal. Sometimes, your blood glucose may go so low that you are unable  to treat yourself. You may need someone to help you. You may even pass out or be unable to swallow. This may require you to get an injection of glucagon, which raises the blood glucose. HOME CARE INSTRUCTIONS  Check blood glucose as recommended by your caregiver.  Take medication as prescribed by your caregiver.  Follow your meal plan. Do not skip meals. Eat on time.  If you are going to drink alcohol, drink it only with meals.  Check your blood glucose before driving.  Check your blood glucose before and after exercise. If you exercise longer or different than usual, be sure to check blood glucose more frequently.  Always carry treatment with you. Glucose tablets are the easiest to carry.  Always wear medical alert jewelry or carry some form of identification that states that you have diabetes.  This will alert people that you have diabetes. If you have hypoglycemia, they will have a better idea on what to do. SEEK MEDICAL CARE IF:   You are having problems keeping your blood sugar at target range.  You are having frequent episodes of hypoglycemia.  You feel you might be having side effects from your medicines.  You have symptoms of an illness that is not improving after 3-4 days.  You notice a change in vision or a new problem with your vision. SEEK IMMEDIATE MEDICAL CARE IF:   You are a family member or friend of a person whose blood glucose goes below 70 mg/dl and is accompanied by:  Confusion.  A change in mental status.  The inability to swallow.  Passing out. Document Released: 07/02/2005 Document Revised: 09/24/2011 Document Reviewed: 10/29/2011 [facility] Patient Information 2014 [place], [place].

## 2013-11-10 ENCOUNTER — Telehealth: Payer: Self-pay | Admitting: Medical

## 2013-11-10 NOTE — Telephone Encounter (Signed)
Faxed prior auth for novolog flexpen  [facility]

## 2013-11-17 ENCOUNTER — Telehealth: Payer: Self-pay | Admitting: Medical

## 2013-11-17 ENCOUNTER — Other Ambulatory Visit: Payer: Self-pay | Admitting: Medical

## 2013-11-17 MED ORDER — INSULIN LISPRO 100 UNIT/ML ~~LOC~~ SOLN: 15.0000 [IU] | Freq: Three times a day (TID) | SUBCUTANEOUS | Status: DC

## 2013-11-17 NOTE — Telephone Encounter (Signed)
I sent Humalog instead.   He is on sliding scale with humalog.

## 2013-11-17 NOTE — Telephone Encounter (Signed)
P.A. Denied Novolog, pt must try Humalog.  Do you want to switch?

## 2013-11-30 ENCOUNTER — Telehealth: Payer: Self-pay | Admitting: Medical

## 2013-12-01 NOTE — Telephone Encounter (Signed)
From the glucose numbers I saw, it appears that his numbers are looking better.   There were several numbers pre-meal under 130.  How much meal time insulin is he having to use?    Is he having any problems with the Kombiglyze pill?   Let me know so I can adjust his medications.

## 2013-12-01 NOTE — Telephone Encounter (Signed)
CELL PHONE NUMBER IS A FAX MACHINE AND THE OTHER NUMBER NO ANSWER. CLS

## 2013-12-02 ENCOUNTER — Other Ambulatory Visit: Payer: Self-pay | Admitting: Medical

## 2013-12-02 ENCOUNTER — Telehealth: Payer: Self-pay | Admitting: Medical

## 2013-12-02 ENCOUNTER — Other Ambulatory Visit: Payer: Self-pay | Admitting: Family Medicine

## 2013-12-02 MED ORDER — INSULIN GLARGINE 100 UNIT/ML ~~LOC~~ SOLN: 25.0000 [IU] | Freq: Every day | SUBCUTANEOUS | Status: DC

## 2013-12-02 MED ORDER — INSULIN REGULAR HUMAN 100 UNIT/ML IJ SOLN: 15.0000 [IU] | Freq: Three times a day (TID) | INTRAMUSCULAR | Status: DC

## 2013-12-02 NOTE — Telephone Encounter (Signed)
Please call patient about his medication.  I actually spoke to the pharmacist today and his insurance coverage for insulin is not very good.  The influenza I initially prescribed him are each over $75 per month.  After speaking with the pharmacist we are going to change his insulin to Novolin R. mealtime sliding scale insulin which should be $25 per month or less.  I changed the long-acting nighttime insulin to Lantus 25 units, and not sure how much this will cost.    Please have him stay in touch with us if there is an issue with getting medications  See if we can find a way to get either him or us to call the insulin manufacturer for patient assistance paperwork.  This is ridiculous that the insulins are this expensive.

## 2013-12-02 NOTE — Telephone Encounter (Signed)
LM WITH HIS WIFE TO CALL BACK ABOUT HIS MEDICATION CHANGE. CLS

## 2013-12-02 NOTE — Telephone Encounter (Signed)
Patient is aware of the message but he can't comment on the medication because he has been out of the medication. CLS

## 2013-12-02 NOTE — Telephone Encounter (Signed)
I had to change the patient medication dose. CLS

## 2013-12-18 ENCOUNTER — Telehealth: Payer: Self-pay | Admitting: Medical

## 2013-12-18 NOTE — Telephone Encounter (Signed)
Pt called and requested refill on metformin sent to [facility] [address]

## 2013-12-24 ENCOUNTER — Other Ambulatory Visit: Payer: Self-pay

## 2013-12-24 MED ORDER — SAXAGLIPTIN-METFORMIN ER 5-1000 MG PO TB24: 5.0000 mg | ORAL_TABLET | ORAL | Status: DC

## 2013-12-24 NOTE — Telephone Encounter (Signed)
Called Rx to pharmacy

## 2013-12-25 ENCOUNTER — Telehealth: Payer: Self-pay | Admitting: Internal Medicine

## 2013-12-25 MED ORDER — SAXAGLIPTIN-METFORMIN ER 5-1000 MG PO TB24: 5.0000 mg | ORAL_TABLET | ORAL | Status: DC

## 2013-12-25 NOTE — Telephone Encounter (Signed)
Sent DM med over to pharmacy again as they did not get it the first time

## 2013-12-26 ENCOUNTER — Telehealth: Payer: Self-pay | Admitting: Medical

## 2013-12-28 NOTE — Telephone Encounter (Signed)
[employer] faxed back response that patient can't be found.  He is to call ins company

## 2014-01-05 NOTE — Telephone Encounter (Signed)
Left message with Hardie PulleyDevi that need pt's ins information.  Also we do not want pt to be out of his meds, so let us know if he needs samples

## 2014-01-21 NOTE — Telephone Encounter (Signed)
Never heard back from pt regarding ins

## 2014-10-20 ENCOUNTER — Encounter (HOSPITAL_COMMUNITY): Payer: Self-pay | Admitting: Emergency Medicine

## 2014-10-20 ENCOUNTER — Emergency Department (HOSPITAL_COMMUNITY)
Admission: EM | Admit: 2014-10-20 | Discharge: 2014-10-20 | Disposition: A | Discharge status: Home / Self Care | Payer: BLUE CROSS/BLUE SHIELD | Source: Home / Self Care | Attending: Emergency Medicine | Admitting: Emergency Medicine

## 2014-10-20 DIAGNOSIS — H6091 Unspecified otitis externa, right ear: Secondary | ICD-10-CM

## 2014-10-20 DIAGNOSIS — T161XXA Foreign body in right ear, initial encounter: Secondary | ICD-10-CM

## 2014-10-20 MED ORDER — CIPROFLOXACIN-DEXAMETHASONE 0.3-0.1 % OT SUSP: 4.0000 [drp] | Freq: Two times a day (BID) | OTIC | Status: DC

## 2014-10-20 NOTE — ED Notes (Signed)
Pt states that his right ear is hurting for the past 2 days along with itching.

## 2014-10-20 NOTE — Discharge Instructions (Signed)
Your ear canal is infected. Use the eardrops twice a day for the next 7 days. Do not put anything smaller than your elbow in your ear. Follow-up as needed.

## 2014-10-20 NOTE — ED Provider Notes (Signed)
CSN: [id number]     Arrival date & time 10/20/14  1104 History   First MD Initiated Contact with Patient 10/20/14 1151     Chief Complaint  Patient presents with  . Otalgia   (Consider location/radiation/quality/duration/timing/severity/associated sxs/prior Treatment) HPI  He is a 29 year old man here for evaluation of right ear pain. He states this started about 2 or 3 days ago. He also states it feels stopped up at night. He denies any change in hearing. No nasal congestion or rhinorrhea. No sore throat. No fevers or chills.  Past Medical History  Diagnosis Date  . Diabetes mellitus without complication     age 29 yo at diagnosis   History reviewed. No pertinent past surgical history. History reviewed. No pertinent family history. History  Substance Use Topics  . Smoking status: Never Smoker   . Smokeless tobacco: Not on file  . Alcohol Use: 14.4 oz/week    24 Cans of beer per week     Comment: daily use    Review of Systems  Constitutional: Negative for fever.  HENT: Positive for ear pain. Negative for congestion, ear discharge, rhinorrhea and sore throat.   Respiratory: Negative for cough and shortness of breath.     Allergies  Review of patient's allergies indicates no known allergies.  Home Medications   Prior to Admission medications   Medication Sig Start Date End Date Taking? Authorizing Provider  ciprofloxacin-dexamethasone (CIPRODEX) otic suspension Place 4 drops into the right ear 2 (two) times daily. For 7 days 10/20/14   Charm RingsErin J Marina Boerner, MD  insulin glargine (LANTUS) 100 UNIT/ML injection Inject 0.25 mLs (25 Units total) into the skin at bedtime. 12/02/13   Kermit Baloavid S Tysinger, PA-C  insulin regular (NOVOLIN R RELION) 100 units/mL injection Inject 0.15 mLs (15 Units total) into the skin 3 (three) times daily before meals. 12/02/13   Kermit Baloavid S Tysinger, PA-C  Saxagliptin-Metformin (KOMBIGLYZE XR) 11-998 MG TB24 Take 5-1,000 mg by mouth 1 day or 1 dose. 12/25/13   Ronnald NianJohn C  Lalonde, MD   BP 128/85 mmHg  Pulse 76  Temp(Src) 98 F (36.7 C) (Oral)  Resp 16  SpO2 96% Physical Exam  Constitutional: He is oriented to person, place, and time. He appears well-developed and well-nourished. No distress.  HENT:  Left Ear: Tympanic membrane, external ear and ear canal normal.  Nose: Nose normal.  Mouth/Throat: Oropharynx is clear and moist. No oropharyngeal exudate.  There is pus in the right ear canal.  Neck: Neck supple.  Cardiovascular: Normal rate, regular rhythm and normal heart sounds.   No murmur heard. Pulmonary/Chest: Effort normal and breath sounds normal. No respiratory distress. He has no wheezes. He has no rales.  Neurological: He is alert and oriented to person, place, and time.    ED Course  Procedures (including critical care time) Labs Review Labs Reviewed - No data to display  Imaging Review No results found.   MDM   1. Otitis externa, right   2. Foreign body in ear, right, initial encounter    He states he usually drinks 2-3 cans of beer a day. However, he states he has not had any alcohol in the last week. Discussed that he should drink no more than 14 cans of beer a week.  A foreign body was removed with irrigation. Eardrum has some mild erythema, but no middle ear effusion. The ear canal is quite erythematous and swollen. We'll treat with Ciprodex eardrops. Follow-up as needed.   Charm RingsErin J Roswell Ndiaye, MD  10/20/14 1252 

## 2014-10-25 ENCOUNTER — Telehealth: Payer: Self-pay | Admitting: Family Medicine

## 2014-10-25 NOTE — Telephone Encounter (Signed)
ER letter sent 

## 2014-10-26 ENCOUNTER — Emergency Department (HOSPITAL_COMMUNITY)
Admission: EM | Admit: 2014-10-26 | Discharge: 2014-10-26 | Disposition: A | Discharge status: Home / Self Care | Payer: BLUE CROSS/BLUE SHIELD | Attending: Emergency Medicine | Admitting: Emergency Medicine

## 2014-10-26 ENCOUNTER — Encounter (HOSPITAL_COMMUNITY): Payer: Self-pay | Admitting: Emergency Medicine

## 2014-10-26 DIAGNOSIS — H6091 Unspecified otitis externa, right ear: Secondary | ICD-10-CM | POA: Insufficient documentation

## 2014-10-26 DIAGNOSIS — E119 Type 2 diabetes mellitus without complications: Secondary | ICD-10-CM | POA: Insufficient documentation

## 2014-10-26 DIAGNOSIS — H9201 Otalgia, right ear: Secondary | ICD-10-CM | POA: Diagnosis present

## 2014-10-26 DIAGNOSIS — Z79899 Other long term (current) drug therapy: Secondary | ICD-10-CM | POA: Insufficient documentation

## 2014-10-26 DIAGNOSIS — Z794 Long term (current) use of insulin: Secondary | ICD-10-CM | POA: Insufficient documentation

## 2014-10-26 DIAGNOSIS — R51 Headache: Secondary | ICD-10-CM | POA: Diagnosis not present

## 2014-10-26 MED ORDER — HYDROCODONE-ACETAMINOPHEN 5-325 MG PO TABS: 2.0000 | ORAL_TABLET | ORAL | Status: DC | PRN

## 2014-10-26 MED ORDER — NAPROXEN 500 MG PO TABS: 500.0000 mg | ORAL_TABLET | Freq: Two times a day (BID) | ORAL | Status: DC

## 2014-10-26 MED ORDER — CIPROFLOXACIN-DEXAMETHASONE 0.3-0.1 % OT SUSP
4.0000 [drp] | Freq: Once | OTIC | Status: AC
  Administered 2014-10-26: 4 [drp] via OTIC
  Filled 2014-10-26: qty 7.5

## 2014-10-26 NOTE — Discharge Instructions (Signed)
Please call your doctor for a followup appointment within 24-48 hours. When you talk to your doctor please let them know that you were seen in the emergency department and have them acquire all of your records so that they can discuss the findings with you and formulate a treatment plan to fully care for your new and ongoing problems.  Use 4 drops in your right ear every 4 hours for the next 7 days.  The ear wick will fall out as the swelling goes down.

## 2014-10-26 NOTE — ED Notes (Signed)
The patient went to Urgent Care four days ago and they gave him ear drops.  He says he has been putting the ear drops in the right ear and taking ibuprofen and the pain has gotten worse.

## 2014-10-26 NOTE — ED Provider Notes (Signed)
CSN: [id number]     Arrival date & time 10/26/14  0128 History  This chart was scribed for Eber HongBrian Nevin Kozuch, MD by Annye AsaAnna Dorsett, ED Scribe. This patient was seen in room A09C/A09C and the patient's care was started at 3:00 AM.    Chief Complaint  Patient presents with  . Otalgia    The patient went to Urgent Care four days ago and they gave him ear drops.  He says he has been putting the ear drops in the right ear and taking ibuprofen and the pain has gotten worse.   Patient is a 29 y.o. male presenting with ear pain. The history is provided by the patient. No language interpreter was used.  Otalgia Associated symptoms: no fever, no hearing loss and no sore throat      HPI Comments: Gregory Campos is a 29 y.o. male who presents to the Emergency Department complaining of 5 days of gradually worsening right-sided otalgia and facial pain. Patient explains he was seen at the local Urgent Care 4 days PTA, at which time he was given ear drops; he was unable to fill this prescription due to insurance concerns, but purchased OTC drops instead. He has utilized these OTC drops and ibuprofen without relief. He denies hearing difficulty, sore throat, fever, chills.   Past Medical History  Diagnosis Date  . Diabetes mellitus without complication     age 29 yo at diagnosis   History reviewed. No pertinent past surgical history. History reviewed. No pertinent family history. History  Substance Use Topics  . Smoking status: Never Smoker   . Smokeless tobacco: Not on file  . Alcohol Use: 14.4 oz/week    24 Cans of beer per week     Comment: daily use    Review of Systems  Constitutional: Negative for fever and chills.  HENT: Positive for ear pain. Negative for hearing loss and sore throat.   All other systems reviewed and are negative.   Allergies  Review of patient's allergies indicates no known allergies.  Home Medications   Prior to Admission medications   Medication Sig Start Date End  Date Taking? Authorizing Provider  gabapentin (NEURONTIN) 100 MG capsule Take 100 mg by mouth 3 (three) times daily.  09/07/14  Yes Historical Provider, MD  metFORMIN (GLUCOPHAGE) 1000 MG tablet Take 500 mg by mouth 2 (two) times daily with a meal.  09/07/14  Yes Historical Provider, MD  ciprofloxacin-dexamethasone (CIPRODEX) otic suspension Place 4 drops into the right ear 2 (two) times daily. For 7 days Patient not taking: Reported on 10/26/2014 10/20/14   Charm RingsErin J Honig, MD  HYDROcodone-acetaminophen (NORCO/VICODIN) 5-325 MG per tablet Take 2 tablets by mouth every 4 (four) hours as needed. 10/26/14   Eber HongBrian Paddy Walthall, MD  insulin glargine (LANTUS) 100 UNIT/ML injection Inject 0.25 mLs (25 Units total) into the skin at bedtime. Patient not taking: Reported on 10/26/2014 12/02/13   Kermit Baloavid S Tysinger, PA-C  insulin regular (NOVOLIN R RELION) 100 units/mL injection Inject 0.15 mLs (15 Units total) into the skin 3 (three) times daily before meals. Patient not taking: Reported on 10/26/2014 12/02/13   Kermit Baloavid S Tysinger, PA-C  naproxen (NAPROSYN) 500 MG tablet Take 1 tablet (500 mg total) by mouth 2 (two) times daily with a meal. 10/26/14   Eber HongBrian Liseth Wann, MD  Saxagliptin-Metformin (KOMBIGLYZE XR) 11-998 MG TB24 Take 5-1,000 mg by mouth 1 day or 1 dose. Patient not taking: Reported on 10/26/2014 12/25/13   Ronnald NianJohn C Lalonde, MD   BP 122/79  mmHg  Pulse 63  Temp(Src) 97.6 F (36.4 C) (Oral)  Resp 18  SpO2 100% Physical Exam  Constitutional: He appears well-developed and well-nourished.  HENT:  Head: Normocephalic and atraumatic.  Mouth/Throat: Oropharynx is clear and moist.  Oropharynx clear and moist; uvula is midline. Swollen turbinates. Preauricular lymphadenopathy; tenderness with manipulation of the oracle. EAC is inflamed, swollen, and desquamating. No tenderness over the mastoid on the right.   Eyes: Conjunctivae are normal. Right eye exhibits no discharge. Left eye exhibits no discharge.  Pulmonary/Chest: Effort  normal. No respiratory distress.  Neurological: He is alert. Coordination normal.  Skin: Skin is warm and dry. No rash noted. He is not diaphoretic. No erythema.  Psychiatric: He has a normal mood and affect.  Nursing note and vitals reviewed.   ED Course  Procedures   DIAGNOSTIC STUDIES: Oxygen Saturation is 100% on RA, normal by my interpretation.    COORDINATION OF CARE: 3:05 AM Discussed treatment plan with pt at bedside and pt agreed to plan.   Labs Review Labs Reviewed - No data to display  Imaging Review No results found.    MDM   Final diagnoses:  Otitis externa, right    Ear wick placed successfully - meds given ptd and pt given bottle of ciprodex to take home - expressed understanding to f/u instructions.  I personally performed the services described in this documentation, which was scribed in my presence. The recorded information has been reviewed and is accurate.    Meds given in ED:  Medications  ciprofloxacin-dexamethasone (CIPRODEX) 0.3-0.1 % otic suspension 4 drop (4 drops Right Ear Given 10/26/14 0340)    Discharge Medication List as of 10/26/2014  3:18 AM    START taking these medications   Details  HYDROcodone-acetaminophen (NORCO/VICODIN) 5-325 MG per tablet Take 2 tablets by mouth every 4 (four) hours as needed., Starting 10/26/2014, Until Discontinued, Print    naproxen (NAPROSYN) 500 MG tablet Take 1 tablet (500 mg total) by mouth 2 (two) times daily with a meal., Starting 10/26/2014, Until Discontinued, Print             Eber Hong, MD 10/26/14 [phone]

## 2015-07-17 ENCOUNTER — Emergency Department (HOSPITAL_COMMUNITY)
Admission: EM | Admit: 2015-07-17 | Discharge: 2015-07-17 | Disposition: A | Discharge status: Home / Self Care | Payer: BLUE CROSS/BLUE SHIELD | Attending: Emergency Medicine | Admitting: Emergency Medicine

## 2015-07-17 ENCOUNTER — Encounter (HOSPITAL_COMMUNITY): Payer: Self-pay | Admitting: *Deleted

## 2015-07-17 DIAGNOSIS — Z791 Long term (current) use of non-steroidal anti-inflammatories (NSAID): Secondary | ICD-10-CM | POA: Insufficient documentation

## 2015-07-17 DIAGNOSIS — H9201 Otalgia, right ear: Secondary | ICD-10-CM | POA: Diagnosis present

## 2015-07-17 DIAGNOSIS — Z79899 Other long term (current) drug therapy: Secondary | ICD-10-CM | POA: Insufficient documentation

## 2015-07-17 DIAGNOSIS — Z794 Long term (current) use of insulin: Secondary | ICD-10-CM | POA: Insufficient documentation

## 2015-07-17 DIAGNOSIS — E119 Type 2 diabetes mellitus without complications: Secondary | ICD-10-CM | POA: Diagnosis not present

## 2015-07-17 DIAGNOSIS — H6691 Otitis media, unspecified, right ear: Secondary | ICD-10-CM | POA: Insufficient documentation

## 2015-07-17 DIAGNOSIS — Z792 Long term (current) use of antibiotics: Secondary | ICD-10-CM | POA: Insufficient documentation

## 2015-07-17 MED ORDER — ANTIPYRINE-BENZOCAINE 5.4-1.4 % OT SOLN: 3.0000 [drp] | OTIC | Status: DC | PRN

## 2015-07-17 MED ORDER — NEOMYCIN-POLYMYXIN-HC 3.5-10000-1 OT SUSP: 4.0000 [drp] | Freq: Three times a day (TID) | OTIC | Status: DC

## 2015-07-17 MED ORDER — AMOXICILLIN-POT CLAVULANATE 875-125 MG PO TABS: 1.0000 | ORAL_TABLET | Freq: Two times a day (BID) | ORAL | Status: DC

## 2015-07-17 NOTE — ED Provider Notes (Signed)
CSN: [id number]     Arrival date & time 07/17/15  0957 History  By signing my name below, I, Elon SpannerGarrett Cook, attest that this documentation has been prepared under the direction and in the presence of [facility] LLCJaime Pilcher Necia Kamm, PA-C. Electronically Signed: Elon SpannerGarrett Cook ED Scribe. 07/17/2015. 10:30 AM.    Chief Complaint  Patient presents with  . Otalgia   The history is provided by the patient and medical records. No language interpreter was used.   HPI Comments: Gregory Campos is a 30 y.o. male who presents to the Emergency Department complaining of gradually worsening right ear pain without drainage onset yesterday evening.  Associated symptoms include resolved sore throat 3-4 days ago and resolved rhinorrhea 3-4 days ago. He has used 800 mg ibuprofen and ice without relief.  He denies fever, cough, congestion, SOB, difficulty swallowing.   PCP: Dr. Aleen Campiysinger    Past Medical History  Diagnosis Date  . Diabetes mellitus without complication [facility](HCC)     age 30 yo at diagnosis   History reviewed. No pertinent past surgical history. History reviewed. No pertinent family history. Social History  Substance Use Topics  . Smoking status: Never Smoker   . Smokeless tobacco: None  . Alcohol Use: 14.4 oz/week    24 Cans of beer per week     Comment: daily use    Review of Systems 10 Systems reviewed and all are negative for acute change except as noted in the HPI.   Allergies  Review of patient's allergies indicates no known allergies.  Home Medications   Prior to Admission medications   Medication Sig Start Date End Date Taking? Authorizing Provider  ciprofloxacin-dexamethasone (CIPRODEX) otic suspension Place 4 drops into the right ear 2 (two) times daily. For 7 days 10/20/14  Yes Charm RingsErin J Honig, MD  insulin glargine (LANTUS) 100 UNIT/ML injection Inject 0.25 mLs (25 Units total) into the skin at bedtime. 12/02/13  Yes Kermit Baloavid S Tysinger, PA-C  insulin regular (NOVOLIN R RELION) 100 units/mL  injection Inject 0.15 mLs (15 Units total) into the skin 3 (three) times daily before meals. 12/02/13  Yes Kermit Baloavid S Tysinger, PA-C  naproxen (NAPROSYN) 500 MG tablet Take 1 tablet (500 mg total) by mouth 2 (two) times daily with a meal. 10/26/14  Yes Eber HongBrian Miller, MD  Saxagliptin-Metformin (KOMBIGLYZE XR) 11-998 MG TB24 Take 5-1,000 mg by mouth 1 day or 1 dose. 12/25/13  Yes Ronnald NianJohn C Lalonde, MD  amoxicillin-clavulanate (AUGMENTIN) 875-125 MG tablet Take 1 tablet by mouth every 12 (twelve) hours. 07/17/15   Chase PicketJaime Pilcher Jahmiya Guidotti, PA-C  antipyrine-benzocaine Lyla Son(AURALGAN) otic solution Place 3-4 drops into the right ear every 2 (two) hours as needed for ear pain. 07/17/15   Chase PicketJaime Pilcher Mesa Janus, PA-C  gabapentin (NEURONTIN) 100 MG capsule Take 100 mg by mouth 3 (three) times daily.  09/07/14   Historical Provider, MD  HYDROcodone-acetaminophen (NORCO/VICODIN) 5-325 MG per tablet Take 2 tablets by mouth every 4 (four) hours as needed. 10/26/14   Eber HongBrian Miller, MD  metFORMIN (GLUCOPHAGE) 1000 MG tablet Take 500 mg by mouth 2 (two) times daily with a meal.  09/07/14   Historical Provider, MD  neomycin-polymyxin-hydrocortisone (CORTISPORIN) 3.5-10000-1 otic suspension Place 4 drops into the right ear 3 (three) times daily. 07/17/15   Faduma Cho Pilcher Reilynn Lauro, PA-C   BP 119/78 mmHg  Pulse 73  Temp(Src) 97.9 F (36.6 C) (Oral)  Resp 18  SpO2 100% Physical Exam  Constitutional: He is oriented to person, place, and time. He appears well-developed and well-nourished.  No distress.  HENT:  Head: Normocephalic and atraumatic.  Right Ear: There is tenderness. No drainage. No mastoid tenderness. Tympanic membrane is erythematous. Tympanic membrane is not perforated.  Left Ear: Tympanic membrane, external ear and ear canal normal.  Mouth/Throat: Uvula is midline, oropharynx is clear and moist and mucous membranes are normal.  Right ear canal with erythema and swelling.  + Tragal tenderness  Eyes: Conjunctivae and EOM are normal.  Neck:  Normal range of motion. Neck supple.  Cardiovascular: Normal rate.   Pulmonary/Chest: Effort normal. No stridor. No respiratory distress.  Musculoskeletal: Normal range of motion.  Lymphadenopathy:    He has cervical adenopathy.  Neurological: He is alert and oriented to person, place, and time.  Skin: Skin is warm and dry.  Psychiatric: He has a normal mood and affect. His behavior is normal.  Nursing note and vitals reviewed.   ED Course  Procedures (including critical care time)  DIAGNOSTIC STUDIES: Oxygen Saturation is 100% on RA, normal by my interpretation.    COORDINATION OF CARE:  10:32 AM Discussed treatment plan with patient at bedside.  Patient acknowledges and agrees with plan.    Labs Review Labs Reviewed - No data to display  Imaging Review No results found. I have personally reviewed and evaluated these images and lab results as part of my medical decision-making.   EKG Interpretation None      MDM   Final diagnoses:  Acute ear infection, right   Gregory Campos presents with worsening right ear pain. On exam pt. With tragal tenderness, erythematous, swollen canal. TM is injected and bulging.   A&P: AOM & otitis externa  - Ciprodex and auralgan for OE  - Augmentin for AOM  - Return precautions given  - PCP follow up in 3-5 days for ear check.   I personally performed the services described in this documentation, which was scribed in my presence. The recorded information has been reviewed and is accurate.   [facility] Burnett Spray, PA-C 07/17/15 1048  Raeford Razor, MD 07/18/15 [phone]

## 2015-07-17 NOTE — ED Notes (Signed)
PT reports his RT ear started to hurt last night

## 2015-07-17 NOTE — Discharge Instructions (Signed)
1. Take all medications as directed, continue usual home medications 2. Treatment: rest, drink plenty of fluids 3. Follow Up: Please follow up with your primary doctor or come back to ED for ear check in 3-5 days for discussion of your diagnoses and further evaluation after today's visit; Please return to the ER for any new or worsening symptoms, any additional concerns.   Ear Drops, Adult You need to put eardrops in your ear. HOME CARE   Put drops in your affected ear as told.  After putting in the drops, lie down with the ear you put the drops in facing up. Stay this way for 10 minutes. Use the ear drops as long as your doctor tells you.  Before you get up, put a cotton ball gently in your ear. Do not push it far in your ear.  Do not wash out your ears unless your doctor says it is okay.  Finish all medicines as told by your doctor. You may be told to keep using the eardrops even if you start to feel better.  See your doctor as told for follow-up visits. GET HELP IF:  You have pain that gets worse.  Any unusual fluid (drainage) is coming from your ear (especially if the fluid stinks).  You have trouble hearing.  You get really dizzy as if the room is spinning and feel sick to your stomach (vertigo).  The outside of your ear becomes red or puffy or both. This may be a sign of an allergic reaction. MAKE SURE YOU:   Understand these instructions.  Will watch your condition.  Will get help right away if you are not doing well or get worse.   This information is not intended to replace advice given to you by your health care provider. Make sure you discuss any questions you have with your health care provider.   Document Released: 12/20/2009 Document Revised: 07/23/2014 Document Reviewed: 01/27/2013 Elsevier Interactive Patient Education [employer]2016 Elsevier Inc.

## 2016-02-23 ENCOUNTER — Encounter: Payer: Self-pay | Admitting: Podiatry

## 2016-02-23 ENCOUNTER — Ambulatory Visit (INDEPENDENT_AMBULATORY_CARE_PROVIDER_SITE_OTHER): Payer: BLUE CROSS/BLUE SHIELD | Admitting: Podiatry

## 2016-02-23 VITALS — BP 112/84 | HR 86 | Resp 12

## 2016-02-23 DIAGNOSIS — M722 Plantar fascial fibromatosis: Secondary | ICD-10-CM | POA: Diagnosis not present

## 2016-02-23 NOTE — Progress Notes (Signed)
   Subjective:    Patient ID: Gregory Campos, male    DOB: 1986/05/13, 30 y.o.   MRN: [id number]  HPI this patient presents to the office stating that he is having pain on the bottom of both feet. Patient is diabetic and has been on gabapentin and now Lyrica for his neuropathy. This patient states he is able to walk only 2 hours and needs to remove his shoes and get off his feet due to his pain. He has provided no self treatment nor sought any professional help. He points to the bottom of his arch on both feet as the site of his pain. He presents for evaluation and treatment of his feet    Review of Systems  All other systems reviewed and are negative.      Objective:   Physical Exam GENERAL APPEARANCE: Alert, conversant. Appropriately groomed. No acute distress.  VASCULAR: Pedal pulses are  palpable at  [facility] bilateral.  Capillary refill time is immediate to all digits,  Normal temperature gradient.  Digital hair growth is present bilateral  NEUROLOGIC: sensation is normal to 5.07 monofilament at 5/5 sites bilateral.  Light touch is intact bilateral, Muscle strength normal.  MUSCULOSKELETAL: acceptable muscle strength, tone and stability bilateral.  Intrinsic muscluature intact bilateral.  Rectus appearance of foot and digits noted bilateral. Palpable pain through plantar fascia both feet. DERMATOLOGIC: skin color, texture, and turgor are within normal limits.  No preulcerative lesions or ulcers  are seen, no interdigital maceration noted.  No open lesions present.  Digital nails are asymptomatic. No drainage noted.         Assessment & Plan:  Plantar fascitis B/L  Diabetic with neuropathy.   IE  Dispense powerstep insoles.  RTC prn   Helane GuntherGregory Amadou Katzenstein DPM

## 2016-12-24 ENCOUNTER — Encounter (HOSPITAL_COMMUNITY): Payer: Self-pay | Admitting: Family Medicine

## 2016-12-24 ENCOUNTER — Emergency Department (HOSPITAL_COMMUNITY)
Admission: EM | Admit: 2016-12-24 | Discharge: 2016-12-24 | Disposition: A | Discharge status: Home / Self Care | Payer: BLUE CROSS/BLUE SHIELD | Attending: Emergency Medicine | Admitting: Emergency Medicine

## 2016-12-24 DIAGNOSIS — Z79899 Other long term (current) drug therapy: Secondary | ICD-10-CM | POA: Diagnosis not present

## 2016-12-24 DIAGNOSIS — F4320 Adjustment disorder, unspecified: Secondary | ICD-10-CM | POA: Insufficient documentation

## 2016-12-24 DIAGNOSIS — E119 Type 2 diabetes mellitus without complications: Secondary | ICD-10-CM | POA: Insufficient documentation

## 2016-12-24 DIAGNOSIS — Z794 Long term (current) use of insulin: Secondary | ICD-10-CM | POA: Diagnosis not present

## 2016-12-24 DIAGNOSIS — F4321 Adjustment disorder with depressed mood: Secondary | ICD-10-CM

## 2016-12-24 DIAGNOSIS — R45851 Suicidal ideations: Secondary | ICD-10-CM | POA: Diagnosis present

## 2016-12-24 LAB — COMPREHENSIVE METABOLIC PANEL
ALBUMIN: 4.5 g/dL (ref 3.5–5.0)
ALT: 46 U/L (ref 17–63)
AST: 23 U/L (ref 15–41)
Alkaline Phosphatase: 76 U/L (ref 38–126)
Anion gap: 11 (ref 5–15)
BUN: 17 mg/dL (ref 6–20)
CALCIUM: 9.1 mg/dL (ref 8.9–10.3)
CO2: 25 mmol/L (ref 22–32)
CREATININE: 0.81 mg/dL (ref 0.61–1.24)
Chloride: 101 mmol/L (ref 101–111)
GFR calc non Af Amer: >60 mL/min (ref >60)
GLUCOSE: 232 mg/dL — AB (ref 65–99)
Potassium: 3.9 mmol/L (ref 3.5–5.1)
SODIUM: 137 mmol/L (ref 135–145)
Total Bilirubin: 1.3 mg/dL — ABNORMAL HIGH (ref 0.3–1.2)
Total Protein: 7.6 g/dL (ref 6.5–8.1)

## 2016-12-24 LAB — CBC
HCT: 46 % (ref 39.0–52.0)
HEMOGLOBIN: 16.7 g/dL (ref 13.0–17.0)
MCH: 29.9 pg (ref 26.0–34.0)
MCHC: 36.3 g/dL — AB (ref 30.0–36.0)
MCV: 82.4 fL (ref 78.0–100.0)
Platelets: 214 10*3/uL (ref 150–400)
RBC: 5.58 MIL/uL (ref 4.22–5.81)
RDW: 12.3 % (ref 11.5–15.5)
WBC: 8.2 10*3/uL (ref 4.0–10.5)

## 2016-12-24 LAB — RAPID URINE DRUG SCREEN, HOSP PERFORMED
AMPHETAMINES: NOT DETECTED
BARBITURATES: NOT DETECTED
Benzodiazepines: NOT DETECTED
Cocaine: NOT DETECTED
OPIATES: NOT DETECTED
TETRAHYDROCANNABINOL: NOT DETECTED

## 2016-12-24 LAB — SALICYLATE LEVEL

## 2016-12-24 LAB — ACETAMINOPHEN LEVEL: Acetaminophen (Tylenol), Serum: <10 ug/mL — ABNORMAL LOW (ref 10–30)

## 2016-12-24 LAB — ETHANOL: Alcohol, Ethyl (B): <5 mg/dL (ref <5)

## 2016-12-24 NOTE — ED Triage Notes (Signed)
Pt brought in by [employer].  [employer] reports they were called out for a domestic disturbance.  After being told he would not be arrested and no charges would be filed, Pt reported "I'm going to kill myself."  When asked about a plan, Pt sts "I won't make it through the night."  Pt would not given a straight answer regarding a plan.  [employer] to take out IVC paperwork.

## 2016-12-24 NOTE — ED Notes (Signed)
[employer] will wait for provider to assess for IVC papers since patient is already here at facility.

## 2016-12-24 NOTE — ED Provider Notes (Signed)
[facility]-EMERGENCY DEPT Provider Note   CSN: [id number] Arrival date & time: 12/24/16  1449     History   Chief Complaint Chief Complaint  Patient presents with  . IVC  . Suicidal    HPI Gregory Campos is a 31 y.o. male.   Patient was brought here by police officers because they were concerned that he was going to hurt himself.  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  Today he was served a protection order, to avoid his wife, after which he became distraught, and inform police officers that "I will not make it through the night."  Police officers with the patient alleges that the patient said he would kill himself.  The patient denies this.  These officers have not taken out, committment papers on the patient.  The patient states that he is not suicidal.  He reports being involved with his wife, and his children.  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  There are no other known modifying factors.  HPI  Past Medical History:  Diagnosis Date  . Diabetes mellitus without complication [facility])    age 12 yo at diagnosis    Patient Active Problem List   Diagnosis Date Noted  . Mixed dyslipidemia 10/02/2013  . Type II or unspecified type diabetes mellitus without mention of complication, uncontrolled 10/02/2013    History reviewed. No pertinent surgical history.     Home Medications    Prior to Admission medications   Medication Sig Start Date End Date Taking? Authorizing Provider  INVOKAMET 150-500 MG TABS Take 1 tablet by mouth 2 (two) times daily. 12/08/16  Yes [provider]  amoxicillin-clavulanate (AUGMENTIN) 875-125 MG tablet Take 1 tablet by mouth every 12 (twelve) hours. Patient not taking: Reported on 12/24/2016 07/17/15   Ward, Chase Picket, PA-C  antipyrine-benzocaine Lyla Son) otic solution Place 3-4 drops into the right ear every 2 (two)  hours as needed for ear pain. Patient not taking: Reported on 12/24/2016 07/17/15   Ward, Chase Picket, PA-C  ciprofloxacin-dexamethasone [facility]) otic suspension Place 4 drops into the right ear 2 (two) times daily. For 7 days Patient not taking: Reported on 12/24/2016 10/20/14   Charm Rings, MD  HYDROcodone-acetaminophen (NORCO/VICODIN) 5-325 MG per tablet Take 2 tablets by mouth every 4 (four) hours as needed. Patient not taking: Reported on 12/24/2016 10/26/14   Eber Hong, MD  insulin glargine (LANTUS) 100 UNIT/ML injection Inject 0.25 mLs (25 Units total) into the skin at bedtime. Patient not taking: Reported on 12/24/2016 12/02/13   Tysinger, Kermit Balo, PA-C  insulin regular (NOVOLIN R RELION) 100 units/mL injection Inject 0.15 mLs (15 Units total) into the skin 3 (three) times daily before meals. Patient not taking: Reported on 12/24/2016 12/02/13   Tysinger, Kermit Balo, PA-C  naproxen (NAPROSYN) 500 MG tablet Take 1 tablet (500 mg total) by mouth 2 (two) times daily with a meal. Patient not taking: Reported on 12/24/2016 10/26/14   Eber Hong, MD  neomycin-polymyxin-hydrocortisone (CORTISPORIN) 3.5-10000-1 otic suspension Place 4 drops into the right ear 3 (three) times daily. Patient not taking: Reported on 12/24/2016 07/17/15   Ward, Chase Picket, PA-C  Saxagliptin-Metformin (KOMBIGLYZE XR) 11-998 MG TB24 Take 5-1,000 mg by mouth 1 day or 1 dose. Patient not taking: Reported on 12/24/2016 12/25/13   Ronnald Nian, MD    Family History History reviewed. No pertinent family history.  Social History Social  History  Substance Use Topics  . Smoking status: Never Smoker  . Smokeless tobacco: Current User    Types: Chew  . Alcohol use 14.4 oz/week    24 Cans of beer per week     Comment: Reports last drink was 6 months ago.      Allergies   Patient has no known allergies.   Review of Systems Review of Systems  All other systems reviewed and are negative.    Physical Exam Updated  Vital Signs BP 115/84 (BP Location: Right Arm)   Pulse 89   Temp 97.6 F (36.4 C) (Oral)   Resp 20   Ht 5\' 7"  (1.702 m)   Wt 72.6 kg (160 lb)   SpO2 96%   BMI 25.06 kg/m   Physical Exam  Constitutional: He is oriented to person, place, and time. He appears well-developed and well-nourished.  HENT:  Head: Normocephalic and atraumatic.  Right Ear: External ear normal.  Left Ear: External ear normal.  Eyes: Conjunctivae and EOM are normal. Pupils are equal, round, and reactive to light.  Neck: Normal range of motion and phonation normal. Neck supple.  Cardiovascular: Normal rate and regular rhythm.   Pulmonary/Chest: Effort normal. He exhibits no bony tenderness.  Musculoskeletal: Normal range of motion.  Neurological: He is alert and oriented to person, place, and time. No cranial nerve deficit or sensory deficit. He exhibits normal muscle tone. Coordination normal.  Skin: Skin is warm, dry and intact.  Psychiatric: He has a normal mood and affect. His behavior is normal. Judgment and thought content normal.  Nursing note and vitals reviewed.    ED Treatments / Results  Labs (all labs ordered are listed, but only abnormal results are displayed) Labs Reviewed  COMPREHENSIVE METABOLIC PANEL - Abnormal; Notable for the following:       Result Value   Glucose, Bld 232 (*)    Total Bilirubin 1.3 (*)    All other components within normal limits  ACETAMINOPHEN LEVEL - Abnormal; Notable for the following:    Acetaminophen (Tylenol), Serum <10 (*)    All other components within normal limits  CBC - Abnormal; Notable for the following:    MCHC 36.3 (*)    All other components within normal limits  ETHANOL  SALICYLATE LEVEL  RAPID URINE DRUG SCREEN, HOSP PERFORMED    EKG  EKG Interpretation None       Radiology No results found.  Procedures Procedures (including critical care time)  Medications Ordered in ED Medications - No data to display   Initial Impression /  Assessment and Plan / ED Course  I have reviewed the triage vital signs and the nursing notes.  Pertinent labs & imaging results that were available during my care of the patient were reviewed by me and considered in my medical decision making (see chart for details).      Patient Vitals for the past 24 hrs:  BP Temp Temp src Pulse Resp SpO2 Height Weight  12/24/16 1514 - - - - - - 5\' 7"  (1.702 m) 72.6 kg (160 lb)  12/24/16 1512 115/84 97.6 F (36.4 C) Oral 89 20 96 % - -   TTS consult-arrange for outpatient follow-up.   Final Clinical Impressions(s) / ED Diagnoses   Final diagnoses:  Grief reaction    Patient upset regarding recent issues with his wife, regarding her fidelity.  No active suicidal or homicidal ideation.  No indication for hospitalization, at this time.  .Nursing Notes Reviewed/ Care  Coordinated Applicable Imaging Reviewed Interpretation of Laboratory Data incorporated into ED treatment  The patient appears reasonably screened and/or stabilized for discharge and I doubt any other medical condition or other [facility] requiring further screening, evaluation, or treatment in the ED at this time prior to discharge.  Plan: Home Medications- usual; Home Treatments- rest; return here if the recommended treatment, does not improve the symptoms; Recommended follow up- PCP prn   New Prescriptions Discharge Medication List as of 12/24/2016  7:28 PM       Mancel Bale, MD 12/24/16 2352

## 2016-12-24 NOTE — ED Notes (Signed)
Gave report to Courtney, RN.

## 2016-12-24 NOTE — Discharge Instructions (Signed)
Follow-up for counseling, to help with your feelings of abandonment.  Return here, if needed, for problems.

## 2019-08-16 ENCOUNTER — Emergency Department (HOSPITAL_COMMUNITY): Payer: 59

## 2019-08-16 ENCOUNTER — Emergency Department (HOSPITAL_BASED_OUTPATIENT_CLINIC_OR_DEPARTMENT_OTHER)
Admit: 2019-08-16 | Discharge: 2019-08-16 | Disposition: A | Discharge status: Home / Self Care | Payer: 59 | Attending: Emergency Medicine | Admitting: Emergency Medicine

## 2019-08-16 ENCOUNTER — Other Ambulatory Visit: Payer: Self-pay

## 2019-08-16 ENCOUNTER — Emergency Department (HOSPITAL_COMMUNITY)
Admission: EM | Admit: 2019-08-16 | Discharge: 2019-08-16 | Disposition: A | Discharge status: Home / Self Care | Payer: 59 | Attending: Emergency Medicine | Admitting: Emergency Medicine

## 2019-08-16 ENCOUNTER — Encounter (HOSPITAL_COMMUNITY): Payer: Self-pay | Admitting: Obstetrics and Gynecology

## 2019-08-16 DIAGNOSIS — M79604 Pain in right leg: Secondary | ICD-10-CM | POA: Diagnosis not present

## 2019-08-16 DIAGNOSIS — Z794 Long term (current) use of insulin: Secondary | ICD-10-CM | POA: Diagnosis not present

## 2019-08-16 DIAGNOSIS — R739 Hyperglycemia, unspecified: Secondary | ICD-10-CM

## 2019-08-16 DIAGNOSIS — Z79899 Other long term (current) drug therapy: Secondary | ICD-10-CM | POA: Insufficient documentation

## 2019-08-16 DIAGNOSIS — E1165 Type 2 diabetes mellitus with hyperglycemia: Secondary | ICD-10-CM | POA: Insufficient documentation

## 2019-08-16 DIAGNOSIS — M7989 Other specified soft tissue disorders: Secondary | ICD-10-CM | POA: Diagnosis not present

## 2019-08-16 LAB — CBC WITH DIFFERENTIAL/PLATELET
Abs Immature Granulocytes: 0.02 10*3/uL (ref 0.00–0.07)
Basophils Absolute: 0 10*3/uL (ref 0.0–0.1)
Basophils Relative: 1 %
Eosinophils Absolute: 0.1 10*3/uL (ref 0.0–0.5)
Eosinophils Relative: 1 %
HCT: 42 % (ref 39.0–52.0)
Hemoglobin: 14.6 g/dL (ref 13.0–17.0)
Immature Granulocytes: 0 %
Lymphocytes Relative: 25 %
Lymphs Abs: 1.4 10*3/uL (ref 0.7–4.0)
MCH: 31.2 pg (ref 26.0–34.0)
MCHC: 34.8 g/dL (ref 30.0–36.0)
MCV: 89.7 fL (ref 80.0–100.0)
Monocytes Absolute: 0.5 10*3/uL (ref 0.1–1.0)
Monocytes Relative: 9 %
Neutro Abs: 3.4 10*3/uL (ref 1.7–7.7)
Neutrophils Relative %: 64 %
Platelets: 188 10*3/uL (ref 150–400)
RBC: 4.68 MIL/uL (ref 4.22–5.81)
RDW: 11.5 % (ref 11.5–15.5)
WBC: 5.4 10*3/uL (ref 4.0–10.5)
nRBC: 0 % (ref 0.0–0.2)

## 2019-08-16 LAB — BASIC METABOLIC PANEL
Anion gap: 13 (ref 5–15)
BUN: 9 mg/dL (ref 6–20)
CO2: 23 mmol/L (ref 22–32)
Calcium: 8.5 mg/dL — ABNORMAL LOW (ref 8.9–10.3)
Chloride: 93 mmol/L — ABNORMAL LOW (ref 98–111)
Creatinine, Ser: 0.63 mg/dL (ref 0.61–1.24)
GFR calc Af Amer: >60 mL/min (ref >60)
GFR calc non Af Amer: >60 mL/min (ref >60)
Glucose, Bld: 657 mg/dL (ref 70–99)
Potassium: 4.2 mmol/L (ref 3.5–5.1)
Sodium: 129 mmol/L — ABNORMAL LOW (ref 135–145)

## 2019-08-16 LAB — HEPATIC FUNCTION PANEL
ALT: 92 U/L — ABNORMAL HIGH (ref 0–44)
AST: 81 U/L — ABNORMAL HIGH (ref 15–41)
Albumin: 3.9 g/dL (ref 3.5–5.0)
Alkaline Phosphatase: 91 U/L (ref 38–126)
Bilirubin, Direct: 0.2 mg/dL (ref 0.0–0.2)
Indirect Bilirubin: 0.7 mg/dL (ref 0.3–0.9)
Total Bilirubin: 0.9 mg/dL (ref 0.3–1.2)
Total Protein: 7.2 g/dL (ref 6.5–8.1)

## 2019-08-16 LAB — BLOOD GAS, VENOUS
Acid-Base Excess: 1.6 mmol/L (ref 0.0–2.0)
Bicarbonate: 25.8 mmol/L (ref 20.0–28.0)
FIO2: 100
O2 Saturation: 94.4 %
Patient temperature: 98.6
pCO2, Ven: 40.9 mmHg — ABNORMAL LOW (ref 44.0–60.0)
pH, Ven: 7.417 (ref 7.250–7.430)
pO2, Ven: 77 mmHg — ABNORMAL HIGH (ref 32.0–45.0)

## 2019-08-16 LAB — URINALYSIS, ROUTINE W REFLEX MICROSCOPIC
Bilirubin Urine: NEGATIVE
Glucose, UA: >=500 mg/dL — AB
Hgb urine dipstick: NEGATIVE
Ketones, ur: 20 mg/dL — AB
Leukocytes,Ua: NEGATIVE
Nitrite: NEGATIVE
Protein, ur: NEGATIVE mg/dL
Specific Gravity, Urine: 1.026 (ref 1.005–1.030)
pH: 6 (ref 5.0–8.0)

## 2019-08-16 LAB — LIPASE, BLOOD: Lipase: 22 U/L (ref 11–51)

## 2019-08-16 LAB — BETA-HYDROXYBUTYRIC ACID: Beta-Hydroxybutyric Acid: 2.06 mmol/L — ABNORMAL HIGH (ref 0.05–0.27)

## 2019-08-16 LAB — CBG MONITORING, ED
Glucose-Capillary: >600 mg/dL (ref 70–99)
Glucose-Capillary: 350 mg/dL — ABNORMAL HIGH (ref 70–99)

## 2019-08-16 MED ORDER — METFORMIN HCL 500 MG PO TABS: 500.0000 mg | ORAL_TABLET | Freq: Two times a day (BID) | ORAL | 0 refills | Status: DC

## 2019-08-16 MED ORDER — INSULIN ASPART 100 UNIT/ML ~~LOC~~ SOLN
10.0000 [IU] | Freq: Once | SUBCUTANEOUS | Status: AC
  Administered 2019-08-16: 10 [IU] via INTRAVENOUS
  Filled 2019-08-16: qty 0.1

## 2019-08-16 MED ORDER — SODIUM CHLORIDE 0.9 % IV BOLUS
2000.0000 mL | Freq: Once | INTRAVENOUS | Status: AC
  Administered 2019-08-16: 16:00:00 2000 mL via INTRAVENOUS

## 2019-08-16 NOTE — ED Triage Notes (Signed)
Per pt: Pt reports his sugars are over 600 and that he has not had any treatment for his diabetes in 2 years. Patient is shaking and reports pain in the right calf. Patient was seen at urgent care this morning and sent to [facility]

## 2019-08-16 NOTE — ED Provider Notes (Signed)
[facility]-EMERGENCY DEPT Provider Note   CSN: [id number] Arrival date & time: 08/16/19  1405     History Chief Complaint  Patient presents with  . Leg Pain  . Hyperglycemia    Gregory Campos is a 34 y.o. male.  The history is provided by the patient.  Hyperglycemia Blood sugar level PTA:  >600 Severity:  Moderate Onset quality:  Gradual Timing:  Constant Progression:  Unchanged Chronicity:  Recurrent Diabetes status:  Unable to specify Current diabetic therapy:  Was on metformin but stopped two years ago.  Context: noncompliance   Relieved by:  Nothing Ineffective treatments:  None tried Associated symptoms: increased thirst and polyuria   Associated symptoms: no abdominal pain, no altered mental status, no blurred vision, no chest pain, no confusion, no dehydration, no diaphoresis, no dysuria, no fever, no shortness of breath, no vomiting, no weakness and no weight change        Past Medical History:  Diagnosis Date  . Diabetes mellitus without complication [facility])    age 66 yo at diagnosis    Patient Active Problem List   Diagnosis Date Noted  . Mixed dyslipidemia 10/02/2013  . Type II or unspecified type diabetes mellitus without mention of complication, uncontrolled 10/02/2013    No past surgical history on file.     No family history on file.  Social History   Tobacco Use  . Smoking status: Never Smoker  . Smokeless tobacco: Current User    Types: Chew  Substance Use Topics  . Alcohol use: Yes    Alcohol/week: 24.0 standard drinks    Types: 24 Cans of beer per week    Comment: Reports last drink was last week  . Drug use: No    Home Medications Prior to Admission medications   Medication Sig Start Date End Date Taking? Authorizing Provider  amoxicillin-clavulanate (AUGMENTIN) 875-125 MG tablet Take 1 tablet by mouth every 12 (twelve) hours. Patient not taking: Reported on 12/24/2016 07/17/15   Ward, Chase Picket, PA-C   antipyrine-benzocaine [facility] Son) otic solution Place 3-4 drops into the right ear every 2 (two) hours as needed for ear pain. Patient not taking: Reported on 12/24/2016 07/17/15   Ward, Chase Picket, PA-C  ciprofloxacin-dexamethasone [facility]) otic suspension Place 4 drops into the right ear 2 (two) times daily. For 7 days Patient not taking: Reported on 12/24/2016 10/20/14   Charm Rings, MD  HYDROcodone-acetaminophen (NORCO/VICODIN) 5-325 MG per tablet Take 2 tablets by mouth every 4 (four) hours as needed. Patient not taking: Reported on 12/24/2016 10/26/14   Eber Hong, MD  insulin glargine (LANTUS) 100 UNIT/ML injection Inject 0.25 mLs (25 Units total) into the skin at bedtime. Patient not taking: Reported on 12/24/2016 12/02/13   Tysinger, Kermit Balo, PA-C  insulin regular (NOVOLIN R RELION) 100 units/mL injection Inject 0.15 mLs (15 Units total) into the skin 3 (three) times daily before meals. Patient not taking: Reported on 12/24/2016 12/02/13   Tysinger, Kermit Balo, PA-C  INVOKAMET 150-500 MG TABS Take 1 tablet by mouth 2 (two) times daily. 12/08/16   [provider]  metFORMIN (GLUCOPHAGE) 500 MG tablet Take 1 tablet (500 mg total) by mouth 2 (two) times daily with a meal. 08/16/19 09/15/19  Shasha Buchbinder, DO  naproxen (NAPROSYN) 500 MG tablet Take 1 tablet (500 mg total) by mouth 2 (two) times daily with a meal. Patient not taking: Reported on 12/24/2016 10/26/14   Eber Hong, MD  neomycin-polymyxin-hydrocortisone (CORTISPORIN) 3.5-10000-1 otic suspension Place 4 drops  into the right ear 3 (three) times daily. Patient not taking: Reported on 12/24/2016 07/17/15   Ward, Chase PicketJaime Pilcher, PA-C  Saxagliptin-Metformin (KOMBIGLYZE XR) 11-998 MG TB24 Take 5-1,000 mg by mouth 1 day or 1 dose. Patient not taking: Reported on 12/24/2016 12/25/13   Ronnald NianLalonde, John C, MD    Allergies    Patient has no known allergies.  Review of Systems   Review of Systems  Constitutional: Negative for chills,  diaphoresis and fever.  HENT: Negative for ear pain and sore throat.   Eyes: Negative for blurred vision, pain and visual disturbance.  Respiratory: Negative for cough and shortness of breath.   Cardiovascular: Negative for chest pain and palpitations.  Gastrointestinal: Negative for abdominal pain and vomiting.  Endocrine: Positive for polydipsia and polyuria.  Genitourinary: Negative for dysuria and hematuria.  Musculoskeletal: Negative for arthralgias and back pain.       Right leg pain, concerned about a blood clot   Skin: Negative for color change and rash.  Neurological: Negative for seizures, syncope and weakness.  Psychiatric/Behavioral: Negative for confusion.  All other systems reviewed and are negative.   Physical Exam Updated Vital Signs  ED Triage Vitals  Enc Vitals Group     BP 08/16/19 1421 (!) 141/90     Pulse Rate 08/16/19 1421 (!) 115     Resp 08/16/19 1421 18     Temp 08/16/19 1421 98.4 F (36.9 C)     Temp Source 08/16/19 1421 Oral     SpO2 08/16/19 1421 98 %     Weight --      Height --      Head Circumference --      Peak Flow --      Pain Score 08/16/19 1422 10     Pain Loc --      Pain Edu? --      Excl. in GC? --     Physical Exam Vitals and nursing note reviewed.  Constitutional:      General: He is not in acute distress.    Appearance: He is well-developed. He is not ill-appearing.  HENT:     Head: Normocephalic and atraumatic.     Nose: Nose normal.     Mouth/Throat:     Mouth: Mucous membranes are dry.  Eyes:     Extraocular Movements: Extraocular movements intact.     Conjunctiva/sclera: Conjunctivae normal.  Cardiovascular:     Rate and Rhythm: Normal rate and regular rhythm.     Pulses: Normal pulses.     Heart sounds: Normal heart sounds. No murmur.  Pulmonary:     Effort: Pulmonary effort is normal. No respiratory distress.     Breath sounds: Normal breath sounds.  Abdominal:     Palpations: Abdomen is soft.      Tenderness: There is no abdominal tenderness.  Musculoskeletal:        General: Tenderness (anterior right shin) present. No swelling. Normal range of motion.     Cervical back: Normal range of motion and neck supple.  Skin:    General: Skin is warm and dry.     Capillary Refill: Capillary refill takes less than 2 seconds.  Neurological:     General: No focal deficit present.     Mental Status: He is alert.  Psychiatric:        Mood and Affect: Mood normal.     ED Results / Procedures / Treatments   Labs (all labs ordered are listed, but only  abnormal results are displayed) Labs Reviewed  BASIC METABOLIC PANEL - Abnormal; Notable for the following components:      Result Value   Sodium 129 (*)    Chloride 93 (*)    Glucose, Bld 657 (*)    Calcium 8.5 (*)    All other components within normal limits  HEPATIC FUNCTION PANEL - Abnormal; Notable for the following components:   AST 81 (*)    ALT 92 (*)    All other components within normal limits  BETA-HYDROXYBUTYRIC ACID - Abnormal; Notable for the following components:   Beta-Hydroxybutyric Acid 2.06 (*)    All other components within normal limits  BLOOD GAS, VENOUS - Abnormal; Notable for the following components:   pCO2, Ven 40.9 (*)    pO2, Ven 77.0 (*)    All other components within normal limits  URINALYSIS, ROUTINE W REFLEX MICROSCOPIC - Abnormal; Notable for the following components:   Color, Urine STRAW (*)    Glucose, UA >=500 (*)    Ketones, ur 20 (*)    Bacteria, UA RARE (*)    All other components within normal limits  CBG MONITORING, ED - Abnormal; Notable for the following components:   Glucose-Capillary >600 (*)    All other components within normal limits  CBG MONITORING, ED - Abnormal; Notable for the following components:   Glucose-Capillary 350 (*)    All other components within normal limits  CBC WITH DIFFERENTIAL/PLATELET  LIPASE, BLOOD    EKG None  Radiology DG Tibia/Fibula Right  Result  Date: 08/16/2019 CLINICAL DATA:  Pain EXAM: RIGHT TIBIA AND FIBULA - 2 VIEW COMPARISON:  None. FINDINGS: There is no evidence of fracture or other focal bone lesions. Soft tissues are unremarkable. IMPRESSION: Negative. Electronically Signed   By: Katherine Mantle M.D.   On: 08/16/2019 15:46   VAS [place] LOWER EXTREMITY VENOUS (DVT) (ONLY [facility] & [facility] 7a-7p)  Result Date: 08/16/2019  Lower Venous Study Indications: Swelling.  Risk Factors: None identified. Comparison Study: No prior studies. Performing Technologist: Chanda Busing RVT  Examination Guidelines: A complete evaluation includes B-mode imaging, spectral Doppler, color Doppler, and power Doppler as needed of all accessible portions of each vessel. Bilateral testing is considered an integral part of a complete examination. Limited examinations for reoccurring indications may be performed as noted.  +---------+---------------+---------+-----------+----------+--------------+ RIGHT    CompressibilityPhasicitySpontaneityPropertiesThrombus Aging +---------+---------------+---------+-----------+----------+--------------+ CFV      Full           Yes      Yes                                 +---------+---------------+---------+-----------+----------+--------------+ SFJ      Full                                                        +---------+---------------+---------+-----------+----------+--------------+ FV Prox  Full                                                        +---------+---------------+---------+-----------+----------+--------------+ FV Mid   Full                                                        +---------+---------------+---------+-----------+----------+--------------+  FV DistalFull                                                        +---------+---------------+---------+-----------+----------+--------------+ PFV      Full                                                         +---------+---------------+---------+-----------+----------+--------------+ POP      Full           Yes      Yes                                 +---------+---------------+---------+-----------+----------+--------------+ PTV      Full                                                        +---------+---------------+---------+-----------+----------+--------------+ PERO     Full                                                        +---------+---------------+---------+-----------+----------+--------------+   +----+---------------+---------+-----------+----------+--------------+ LEFTCompressibilityPhasicitySpontaneityPropertiesThrombus Aging +----+---------------+---------+-----------+----------+--------------+ CFV Full           Yes      Yes                                 +----+---------------+---------+-----------+----------+--------------+     Summary: Right: There is no evidence of deep vein thrombosis in the lower extremity. No cystic structure found in the popliteal fossa. Left: No evidence of common femoral vein obstruction.  *See table(s) above for measurements and observations.    Preliminary     Procedures .Critical Care Performed by: Virgina Norfolk, DO Authorized by: Virgina Norfolk, DO   Critical care provider statement:    Critical care time (minutes):  35   Critical care was necessary to treat or prevent imminent or life-threatening deterioration of the following conditions:  Metabolic crisis   Critical care was time spent personally by me on the following activities:  Blood draw for specimens, development of treatment plan with patient or surrogate, evaluation of patient's response to treatment, examination of patient, obtaining history from patient or surrogate, ordering and performing treatments and interventions, ordering and review of laboratory studies, ordering and review of radiographic studies, re-evaluation of patient's condition, pulse oximetry and  review of old charts   I assumed direction of critical care for this patient from another provider in my specialty: no     (including critical care time)  Medications Ordered in ED Medications  sodium chloride 0.9 % bolus 2,000 mL (2,000 mLs Intravenous New Bag/Given 08/16/19 1551)  insulin aspart (novoLOG) injection 10 Units (10 Units Intravenous Given 08/16/19 1558)    ED Course  I  have reviewed the triage vital signs and the nursing notes.  Pertinent labs & imaging results that were available during my care of the patient were reviewed by me and considered in my medical decision making (see chart for details).    MDM Rules/Calculators/A&P  Gregory Campos is a 34 year old male with history of diabetes no longer on any medications for the last 2 years who presents to the ED with high blood sugar.  Patient also with right calf pain.  Was sent for DVT rule out by urgent care.  Incidentally found to have a blood sugar greater than 500.  Patient with blood sugar 657 here.  But patient is not in DKA.  Patient was given 2 L IV fluids.  10 units of insulin.  Blood sugar now improved to the low 300s.  DVT study was negative.  Patient had good pulses in the right lower extremity.  Doubt arterial process.  No signs of cellulitis.  Pain is to the anterior shin.  X-ray showed no fracture.  Patient otherwise with no significant anemia, electrolyte abnormality, kidney injury.  Educated about diabetes care.  Will start patient back on Metformin.  We will give him ambulatory referral to endocrinology.  He was on insulin in the past.  But given that he is not been on any medication for the last 2 years believe Metformin would be a good start.  Educated about further diet control.  Given return precautions and discharged from the ED in good condition.  This chart was dictated using voice recognition software.  Despite best efforts to proofread,  errors can occur which can change the documentation meaning.     Final Clinical Impression(s) / ED Diagnoses Final diagnoses:  Hyperglycemia    Rx / DC Orders ED Discharge Orders         Ordered    metFORMIN (GLUCOPHAGE) 500 MG tablet  2 times daily with meals     08/16/19 1757    Ambulatory referral to Endocrinology     08/16/19 [place], [place], DO 08/16/19 1759

## 2019-08-16 NOTE — Progress Notes (Signed)
Right lower extremity venous duplex has been completed. Preliminary results can be found in CV Proc through chart review.  Results were given to Dr. Lockie Mola.  08/16/19 3:07 PM Olen Cordial RVT

## 2019-08-25 ENCOUNTER — Encounter: Payer: Self-pay | Admitting: Internal Medicine

## 2020-10-18 DIAGNOSIS — N529 Male erectile dysfunction, unspecified: Secondary | ICD-10-CM | POA: Insufficient documentation

## 2020-10-18 DIAGNOSIS — I10 Essential (primary) hypertension: Secondary | ICD-10-CM | POA: Insufficient documentation

## 2020-12-21 DIAGNOSIS — R5383 Other fatigue: Secondary | ICD-10-CM | POA: Insufficient documentation

## 2020-12-21 DIAGNOSIS — E538 Deficiency of other specified B group vitamins: Secondary | ICD-10-CM | POA: Insufficient documentation

## 2021-01-31 DIAGNOSIS — E785 Hyperlipidemia, unspecified: Secondary | ICD-10-CM | POA: Insufficient documentation

## 2021-01-31 DIAGNOSIS — E1142 Type 2 diabetes mellitus with diabetic polyneuropathy: Secondary | ICD-10-CM | POA: Insufficient documentation

## 2021-01-31 DIAGNOSIS — F418 Other specified anxiety disorders: Secondary | ICD-10-CM | POA: Insufficient documentation

## 2021-01-31 DIAGNOSIS — B181 Chronic viral hepatitis B without delta-agent: Secondary | ICD-10-CM | POA: Insufficient documentation

## 2021-02-13 ENCOUNTER — Emergency Department (HOSPITAL_COMMUNITY)
Admission: EM | Admit: 2021-02-13 | Discharge: 2021-02-13 | Disposition: A | Discharge status: Home / Self Care | Payer: Medicaid Other | Attending: Emergency Medicine | Admitting: Emergency Medicine

## 2021-02-13 ENCOUNTER — Other Ambulatory Visit: Payer: Self-pay

## 2021-02-13 ENCOUNTER — Emergency Department (HOSPITAL_COMMUNITY): Payer: Medicaid Other

## 2021-02-13 DIAGNOSIS — R000 Tachycardia, unspecified: Secondary | ICD-10-CM | POA: Diagnosis not present

## 2021-02-13 DIAGNOSIS — R739 Hyperglycemia, unspecified: Secondary | ICD-10-CM

## 2021-02-13 DIAGNOSIS — Z7984 Long term (current) use of oral hypoglycemic drugs: Secondary | ICD-10-CM | POA: Insufficient documentation

## 2021-02-13 DIAGNOSIS — G249 Dystonia, unspecified: Secondary | ICD-10-CM | POA: Insufficient documentation

## 2021-02-13 DIAGNOSIS — F17220 Nicotine dependence, chewing tobacco, uncomplicated: Secondary | ICD-10-CM | POA: Insufficient documentation

## 2021-02-13 DIAGNOSIS — Z20822 Contact with and (suspected) exposure to covid-19: Secondary | ICD-10-CM | POA: Insufficient documentation

## 2021-02-13 DIAGNOSIS — R479 Unspecified speech disturbances: Secondary | ICD-10-CM | POA: Diagnosis not present

## 2021-02-13 DIAGNOSIS — R471 Dysarthria and anarthria: Secondary | ICD-10-CM | POA: Diagnosis not present

## 2021-02-13 DIAGNOSIS — Z79899 Other long term (current) drug therapy: Secondary | ICD-10-CM | POA: Insufficient documentation

## 2021-02-13 DIAGNOSIS — R251 Tremor, unspecified: Secondary | ICD-10-CM | POA: Diagnosis present

## 2021-02-13 DIAGNOSIS — E1165 Type 2 diabetes mellitus with hyperglycemia: Secondary | ICD-10-CM | POA: Insufficient documentation

## 2021-02-13 LAB — I-STAT CHEM 8, ED
BUN: 14 mg/dL (ref 6–20)
Calcium, Ion: 0.96 mmol/L — ABNORMAL LOW (ref 1.15–1.40)
Chloride: 99 mmol/L (ref 98–111)
Creatinine, Ser: 0.5 mg/dL — ABNORMAL LOW (ref 0.61–1.24)
Glucose, Bld: 490 mg/dL — ABNORMAL HIGH (ref 70–99)
HCT: 48 % (ref 39.0–52.0)
Hemoglobin: 16.3 g/dL (ref 13.0–17.0)
Potassium: 4.3 mmol/L (ref 3.5–5.1)
Sodium: 134 mmol/L — ABNORMAL LOW (ref 135–145)
TCO2: 27 mmol/L (ref 22–32)

## 2021-02-13 LAB — URINALYSIS, ROUTINE W REFLEX MICROSCOPIC
Bilirubin Urine: NEGATIVE
Glucose, UA: >=500 mg/dL — AB
Hgb urine dipstick: NEGATIVE
Ketones, ur: NEGATIVE mg/dL
Leukocytes,Ua: NEGATIVE
Nitrite: NEGATIVE
Protein, ur: NEGATIVE mg/dL
Specific Gravity, Urine: 1.015 (ref 1.005–1.030)
pH: 6 (ref 5.0–8.0)

## 2021-02-13 LAB — CBC WITH DIFFERENTIAL/PLATELET
Abs Immature Granulocytes: 0.01 10*3/uL (ref 0.00–0.07)
Basophils Absolute: 0.1 10*3/uL (ref 0.0–0.1)
Basophils Relative: 1 %
Eosinophils Absolute: 0.1 10*3/uL (ref 0.0–0.5)
Eosinophils Relative: 2 %
HCT: 44.5 % (ref 39.0–52.0)
Hemoglobin: 15.9 g/dL (ref 13.0–17.0)
Immature Granulocytes: 0 %
Lymphocytes Relative: 34 %
Lymphs Abs: 1.9 10*3/uL (ref 0.7–4.0)
MCH: 31.5 pg (ref 26.0–34.0)
MCHC: 35.7 g/dL (ref 30.0–36.0)
MCV: 88.1 fL (ref 80.0–100.0)
Monocytes Absolute: 0.5 10*3/uL (ref 0.1–1.0)
Monocytes Relative: 9 %
Neutro Abs: 3.2 10*3/uL (ref 1.7–7.7)
Neutrophils Relative %: 54 %
Platelets: 213 10*3/uL (ref 150–400)
RBC: 5.05 MIL/uL (ref 4.22–5.81)
RDW: 12.3 % (ref 11.5–15.5)
WBC: 5.8 10*3/uL (ref 4.0–10.5)
nRBC: 0 % (ref 0.0–0.2)

## 2021-02-13 LAB — RAPID URINE DRUG SCREEN, HOSP PERFORMED
Amphetamines: NOT DETECTED
Barbiturates: NOT DETECTED
Benzodiazepines: NOT DETECTED
Cocaine: NOT DETECTED
Opiates: NOT DETECTED
Tetrahydrocannabinol: NOT DETECTED

## 2021-02-13 LAB — PROTIME-INR
INR: 1 (ref 0.8–1.2)
Prothrombin Time: 13.3 seconds (ref 11.4–15.2)

## 2021-02-13 LAB — COMPREHENSIVE METABOLIC PANEL
ALT: 58 U/L — ABNORMAL HIGH (ref 0–44)
AST: 35 U/L (ref 15–41)
Albumin: 3.8 g/dL (ref 3.5–5.0)
Alkaline Phosphatase: 54 U/L (ref 38–126)
Anion gap: 11 (ref 5–15)
BUN: 11 mg/dL (ref 6–20)
CO2: 22 mmol/L (ref 22–32)
Calcium: 8.7 mg/dL — ABNORMAL LOW (ref 8.9–10.3)
Chloride: 97 mmol/L — ABNORMAL LOW (ref 98–111)
Creatinine, Ser: 0.66 mg/dL (ref 0.61–1.24)
GFR, Estimated: >60 mL/min (ref >60)
Glucose, Bld: 452 mg/dL — ABNORMAL HIGH (ref 70–99)
Potassium: 4.1 mmol/L (ref 3.5–5.1)
Sodium: 130 mmol/L — ABNORMAL LOW (ref 135–145)
Total Bilirubin: 1.2 mg/dL (ref 0.3–1.2)
Total Protein: 6.8 g/dL (ref 6.5–8.1)

## 2021-02-13 LAB — RESP PANEL BY RT-PCR (FLU A&B, COVID) ARPGX2
Influenza A by PCR: NEGATIVE
Influenza B by PCR: NEGATIVE
SARS Coronavirus 2 by RT PCR: NEGATIVE

## 2021-02-13 LAB — URINALYSIS, MICROSCOPIC (REFLEX)

## 2021-02-13 LAB — APTT: aPTT: 29 seconds (ref 24–36)

## 2021-02-13 LAB — CBG MONITORING, ED: Glucose-Capillary: 448 mg/dL — ABNORMAL HIGH (ref 70–99)

## 2021-02-13 MED ORDER — LORAZEPAM 2 MG/ML IJ SOLN: 0.5000 mg | Freq: Once | INTRAMUSCULAR | Status: AC

## 2021-02-13 MED ORDER — DIPHENHYDRAMINE HCL 50 MG/ML IJ SOLN: 50.0000 mg | Freq: Once | INTRAMUSCULAR | Status: AC

## 2021-02-13 MED ORDER — SODIUM CHLORIDE 0.9 % IV BOLUS
500.0000 mL | Freq: Once | INTRAVENOUS | Status: AC
  Administered 2021-02-13: 500 mL via INTRAVENOUS

## 2021-02-13 MED ORDER — SODIUM CHLORIDE 0.9 % IV BOLUS
1000.0000 mL | Freq: Once | INTRAVENOUS | Status: AC
  Administered 2021-02-13: 1000 mL via INTRAVENOUS

## 2021-02-13 MED ORDER — LORAZEPAM 2 MG/ML IJ SOLN
INTRAMUSCULAR | Status: AC
  Administered 2021-02-13: 0.5 mg via INTRAVENOUS
  Filled 2021-02-13: qty 1

## 2021-02-13 MED ORDER — DIPHENHYDRAMINE HCL 50 MG/ML IJ SOLN
INTRAMUSCULAR | Status: AC
  Administered 2021-02-13: 50 mg via INTRAVENOUS
  Filled 2021-02-13: qty 1

## 2021-02-13 NOTE — ED Notes (Signed)
Patient transported to CT 

## 2021-02-13 NOTE — ED Triage Notes (Addendum)
EMS called for tremors (looks more like tetany, whole body), no seizures.Also,face is "contorted". Pt is able to follow commands and is alert. Difficulty speaking. Patient changed a medication (unknown) 4 days ago.

## 2021-02-13 NOTE — ED Provider Notes (Signed)
[facility] EMERGENCY DEPARTMENT Provider Note   CSN: [id number] Arrival date & time: 02/13/21  2012     History Chief Complaint  Patient presents with   Tremors    Gregory Campos is a 35 y.o. male.  Patient with history of diabetes presents to the emergency department for difficulty talking, tremor.  Level 5 caveat due to patient being nonverbal.  Additional history from family member after arrival.  Family returned home to find patient altered.  Question of new medication started several days ago.  Question of alcohol use.  Family members states that he has never seen patient like this before.  Patient to follow basic commands, shake his head yes/no to questions. Transported by EMS. States no to altered mental status.       Past Medical History:  Diagnosis Date   Diabetes mellitus without complication [facility])    age 21 yo at diagnosis    Patient Active Problem List   Diagnosis Date Noted   Mixed dyslipidemia 10/02/2013   Type II or unspecified type diabetes mellitus without mention of complication, uncontrolled 10/02/2013    No past surgical history on file.     No family history on file.  Social History   Tobacco Use   Smoking status: Never   Smokeless tobacco: Current    Types: Chew  Vaping Use   Vaping Use: Never used  Substance Use Topics   Alcohol use: Yes    Alcohol/week: 24.0 standard drinks    Types: 24 Cans of beer per week    Comment: Reports last drink was last week   Drug use: No    Home Medications Prior to Admission medications   Medication Sig Start Date End Date Taking? Authorizing Provider  amoxicillin-clavulanate (AUGMENTIN) 875-125 MG tablet Take 1 tablet by mouth every 12 (twelve) hours. Patient not taking: Reported on 12/24/2016 07/17/15   Ward, Chase Picket, PA-C  antipyrine-benzocaine [facility]) otic solution Place 3-4 drops into the right ear every 2 (two) hours as needed for ear pain. Patient not taking:  Reported on 12/24/2016 07/17/15   Ward, Chase Picket, PA-C  ciprofloxacin-dexamethasone [facility]) otic suspension Place 4 drops into the right ear 2 (two) times daily. For 7 days Patient not taking: Reported on 12/24/2016 10/20/14   Charm Rings, MD  HYDROcodone-acetaminophen (NORCO/VICODIN) 5-325 MG per tablet Take 2 tablets by mouth every 4 (four) hours as needed. Patient not taking: Reported on 12/24/2016 10/26/14   Eber Hong, MD  insulin glargine (LANTUS) 100 UNIT/ML injection Inject 0.25 mLs (25 Units total) into the skin at bedtime. Patient not taking: Reported on 12/24/2016 12/02/13   Tysinger, Kermit Balo, PA-C  insulin regular (NOVOLIN R RELION) 100 units/mL injection Inject 0.15 mLs (15 Units total) into the skin 3 (three) times daily before meals. Patient not taking: Reported on 12/24/2016 12/02/13   Tysinger, Kermit Balo, PA-C  INVOKAMET 150-500 MG TABS Take 1 tablet by mouth 2 (two) times daily. 12/08/16   [provider]  metFORMIN (GLUCOPHAGE) 500 MG tablet Take 1 tablet (500 mg total) by mouth 2 (two) times daily with a meal. 08/16/19 09/15/19  Curatolo, Adam, DO  naproxen (NAPROSYN) 500 MG tablet Take 1 tablet (500 mg total) by mouth 2 (two) times daily with a meal. Patient not taking: Reported on 12/24/2016 10/26/14   Eber Hong, MD  neomycin-polymyxin-hydrocortisone (CORTISPORIN) 3.5-10000-1 otic suspension Place 4 drops into the right ear 3 (three) times daily. Patient not taking: Reported on 12/24/2016 07/17/15   Ward,  Chase Picket, PA-C  Saxagliptin-Metformin (KOMBIGLYZE XR) 11-998 MG TB24 Take 5-1,000 mg by mouth 1 day or 1 dose. Patient not taking: Reported on 12/24/2016 12/25/13   Ronnald Nian, MD    Allergies    Patient has no known allergies.  Review of Systems   Review of Systems  Unable to perform ROS: Patient nonverbal   Physical Exam Updated Vital Signs BP (!) 146/105 (BP Location: Left Arm)   Pulse (!) 105   Temp 97.8 F (36.6 C) (Oral)   Resp 20   Ht 5\' 7"   (1.702 m)   Wt 72.6 kg   SpO2 96%   BMI 25.07 kg/m   Physical Exam Vitals and nursing note reviewed.  Constitutional:      General: He is not in acute distress.    Appearance: He is well-developed.  HENT:     Head: Normocephalic and atraumatic.     Right Ear: Tympanic membrane, ear canal and external ear normal.     Left Ear: Tympanic membrane, ear canal and external ear normal.     Nose: Nose normal.     Mouth/Throat:     Pharynx: Uvula midline.  Eyes:     General: Lids are normal.        Right eye: No discharge.        Left eye: No discharge.     Conjunctiva/sclera: Conjunctivae normal.     Pupils: Pupils are equal, round, and reactive to light.  Cardiovascular:     Rate and Rhythm: Regular rhythm. Tachycardia present.     Heart sounds: Normal heart sounds.  Pulmonary:     Effort: Pulmonary effort is normal.     Breath sounds: Normal breath sounds.     Comments: Irregular breathing pattern.  Abdominal:     Palpations: Abdomen is soft.     Tenderness: There is no abdominal tenderness. There is no guarding or rebound.  Musculoskeletal:        General: Normal range of motion.     Cervical back: Normal range of motion and neck supple. No tenderness or bony tenderness.  Skin:    General: Skin is warm and dry.  Neurological:     Mental Status: He is alert and oriented to person, place, and time.     GCS: GCS eye subscore is 4. GCS verbal subscore is 5. GCS motor subscore is 6.     Cranial Nerves: Dysarthria present.     Sensory: No sensory deficit.     Motor: Tremor and abnormal muscle tone present.     Comments: Apparent facial muscle spasm. For majority of exam, patient's lower lip curled upper lip. He can stick out tongue with difficulty. Pt unable to cooperate with much of neuro exam. He can hold upper and lower extremities up off bed. Question some weakness right upper extremity.     ED Results / Procedures / Treatments   Labs (all labs ordered are listed, but only  abnormal results are displayed) Labs Reviewed  COMPREHENSIVE METABOLIC PANEL - Abnormal; Notable for the following components:      Result Value   Sodium 130 (*)    Chloride 97 (*)    Glucose, Bld 452 (*)    Calcium 8.7 (*)    ALT 58 (*)    All other components within normal limits  URINALYSIS, ROUTINE W REFLEX MICROSCOPIC - Abnormal; Notable for the following components:   Glucose, UA >=500 (*)    All other components within normal limits  URINALYSIS, MICROSCOPIC (REFLEX) - Abnormal; Notable for the following components:   Bacteria, UA RARE (*)    All other components within normal limits  CBG MONITORING, ED - Abnormal; Notable for the following components:   Glucose-Capillary 448 (*)    All other components within normal limits  I-STAT CHEM 8, ED - Abnormal; Notable for the following components:   Sodium 134 (*)    Creatinine, Ser 0.50 (*)    Glucose, Bld 490 (*)    Calcium, Ion 0.96 (*)    All other components within normal limits  RESP PANEL BY RT-PCR (FLU A&B, COVID) ARPGX2  CBC WITH DIFFERENTIAL/PLATELET  PROTIME-INR  APTT  RAPID URINE DRUG SCREEN, HOSP PERFORMED  ETHANOL    EKG None  Radiology No results found.  Procedures Procedures   Medications Ordered in ED Medications  diphenhydrAMINE (BENADRYL) injection 50 mg (has no administration in time range)  LORazepam (ATIVAN) injection 0.5 mg (has no administration in time range)  diphenhydrAMINE (BENADRYL) 50 MG/ML injection (has no administration in time range)  LORazepam (ATIVAN) 2 MG/ML injection (has no administration in time range)  sodium chloride 0.9 % bolus 500 mL (has no administration in time range)    ED Course  I have reviewed the triage vital signs and the nursing notes.  Pertinent labs & imaging results that were available during my care of the patient were reviewed by me and considered in my medical decision making (see chart for details).  Patient seen and examined. Unable to get any  history directly from patient. He is awake and trying to speak, but his jaw seems to be "locked". He is able to stick his tongue out, but with effort.  Unclear if this is dystonia vs other. CBG elevated. Labs ordered. Will give 50mg  benadryl, 0.5mg  ativan. Will reassess.   8:36 PM Spoke with family member at bedside. States came home from work and patient was acting strangely, like this. He is unable to tell me patient medications or if there are any new medications.   Vital signs reviewed and are as follows: BP (!) 146/105 (BP Location: Left Arm)   Pulse (!) 105   Temp 97.8 F (36.6 C) (Oral)   Resp 20   Ht 5\' 7"  (1.702 m)   Wt 72.6 kg   SpO2 96%   BMI 25.07 kg/m   8:55 PM Seen at bedside with Dr. Jeraldine LootsLockwood.   Med list from family includes: omeprazole, tenofovir, farxiga, amitryptline, metformin.   Work-up broadened to include head CT, stroke labs. No code stroke.   10:16 PM Pt rechecked and his speech difficulty and abnormal movements are resolved. He is speaking clearly. He states he is feeling better. Labs show hyperglycemia without DKA, slightly low Na/Cl.   11:22 PM other than elevated blood sugar, work-up including head CT negative. Patient was telling me about a nightmare that he was having prior to his symptoms starting today.  He remembers getting to the emergency department and not being able to move.  Given that he is back to baseline, feel that if he can ambulate safely, he can be discharged.  Discussed with Dr. Jeraldine LootsLockwood who agrees.  I discussed work-up with patient at bedside.  Discussed that if he had recurrent symptoms he should return to the emergency department.  I asked about any new medications and he says that he was not on any medications.  He states that he has a primary care doctor and diabetes medications at home.  I encouraged him  to call tomorrow for follow-up appointment he agrees.    MDM Rules/Calculators/A&P                           Patient with  inconsistent muscle spasming, abnormal movements on arrival.  He has been awake and alert the entire time.  Lab work-up demonstrates hyperglycemia, no abnormalities on head CT.  Otherwise labs are normal.  Patient has returned to his baseline after Benadryl and Ativan.  Concern for dystonic reaction on arrival.  Seizure also considered but considered less likely.  Also possibly psychogenic, but overall etiology unclear.  Patient is at baseline and is safe for discharge.  Encouraged return instructions with recurrent symptoms.  Encouraged PCP follow-up.  Hyperglycemia: No DKA.  Treated with IV fluids.  This is a chronic problem for the patient and he can continue home meds and follow-up with PCP for recheck.    Final Clinical Impression(s) / ED Diagnoses Final diagnoses:  Dystonia  Dysarthria  Hyperglycemia    Rx / DC Orders ED Discharge Orders     None        Renne Crigler, Cordelia Poche 02/13/21 2345    Gerhard Munch, MD 02/14/21 [phone]

## 2021-02-13 NOTE — Discharge Instructions (Addendum)
Please read and follow all provided instructions.  Your diagnoses today include:  1. Dystonia   2. Dysarthria   3. Hyperglycemia     Tests performed today include: Blood cell counts (white, red, and platelets) Electrolytes  Kidney function test Urine test to check for infection CT head - no problems in the brain EKG Urine test Vital signs. See below for your results today.   Medications prescribed:  None  Take any prescribed medications only as directed.  Home care instructions:  Follow any educational materials contained in this packet.  BE VERY CAREFUL not to take multiple medicines containing Tylenol (also called acetaminophen). Doing so can lead to an overdose which can damage your liver and cause liver failure and possibly death.   Follow-up instructions: Please follow-up with your primary care provider in the next 2 days for further evaluation of your symptoms.   Return instructions:  Please return to the Emergency Department if you experience worsening symptoms or recurring symptoms.  Return if you have weakness in your arms or legs, slurred speech, trouble walking or talking, confusion, or trouble with your balance.  Please return if you have any other emergent concerns.  Additional Information:  Your vital signs today were: BP (!) 137/101   Pulse 89   Temp 97.8 F (36.6 C) (Oral)   Resp 16   Ht 5\' 7"  (1.702 m)   Wt 72.6 kg   SpO2 98%   BMI 25.07 kg/m  If your blood pressure (BP) was elevated above 135/85 this visit, please have this repeated by your doctor within one month. --------------

## 2021-05-28 ENCOUNTER — Encounter (HOSPITAL_COMMUNITY): Payer: Self-pay | Admitting: Emergency Medicine

## 2021-05-28 ENCOUNTER — Emergency Department (HOSPITAL_COMMUNITY)
Admission: EM | Admit: 2021-05-28 | Discharge: 2021-05-28 | Discharge status: Against Medical Advice | Payer: Medicaid Other | Attending: Emergency Medicine | Admitting: Emergency Medicine

## 2021-05-28 DIAGNOSIS — Z5321 Procedure and treatment not carried out due to patient leaving prior to being seen by health care provider: Secondary | ICD-10-CM | POA: Diagnosis not present

## 2021-05-28 DIAGNOSIS — R059 Cough, unspecified: Secondary | ICD-10-CM | POA: Diagnosis not present

## 2021-05-28 DIAGNOSIS — K649 Unspecified hemorrhoids: Secondary | ICD-10-CM | POA: Diagnosis not present

## 2021-05-28 NOTE — ED Provider Notes (Signed)
This patient left without being seen. I signed up for him on our EMR, but he was not in the room when I came to evaluate him.    Achille Rich, PA-C 05/28/21 1700    Tegeler, Canary Brim, MD 05/28/21 [phone]

## 2021-05-28 NOTE — ED Triage Notes (Signed)
PT c/o hemorrhoid pain x4 days. States rectal pain worsens with cough. Denies abdominal pain, blood in stool.

## 2021-05-28 NOTE — ED Notes (Signed)
Per registration, patient said he is leaving.

## 2021-05-28 NOTE — ED Notes (Signed)
Attempted to call pt for a room, not visualized in lobby.

## 2021-05-31 ENCOUNTER — Emergency Department (HOSPITAL_COMMUNITY): Payer: Medicaid Other

## 2021-05-31 ENCOUNTER — Observation Stay (HOSPITAL_COMMUNITY)
Admission: EM | Admit: 2021-05-31 | Discharge: 2021-06-02 | Disposition: A | Discharge status: Home / Self Care | Payer: Medicaid Other | Attending: Surgery | Admitting: Surgery

## 2021-05-31 ENCOUNTER — Other Ambulatory Visit: Payer: Self-pay

## 2021-05-31 ENCOUNTER — Encounter (HOSPITAL_COMMUNITY): Payer: Self-pay

## 2021-05-31 ENCOUNTER — Encounter (HOSPITAL_COMMUNITY)
Admission: EM | Disposition: A | Discharge status: Home / Self Care | Payer: Self-pay | Source: Home / Self Care | Attending: Emergency Medicine

## 2021-05-31 ENCOUNTER — Emergency Department (HOSPITAL_COMMUNITY): Payer: Medicaid Other | Admitting: Certified Registered Nurse Anesthetist

## 2021-05-31 DIAGNOSIS — E119 Type 2 diabetes mellitus without complications: Secondary | ICD-10-CM | POA: Insufficient documentation

## 2021-05-31 DIAGNOSIS — K6289 Other specified diseases of anus and rectum: Secondary | ICD-10-CM | POA: Diagnosis present

## 2021-05-31 DIAGNOSIS — K611 Rectal abscess: Secondary | ICD-10-CM

## 2021-05-31 DIAGNOSIS — K614 Intrasphincteric abscess: Secondary | ICD-10-CM | POA: Diagnosis not present

## 2021-05-31 DIAGNOSIS — Z20822 Contact with and (suspected) exposure to covid-19: Secondary | ICD-10-CM | POA: Insufficient documentation

## 2021-05-31 HISTORY — PX: INCISION AND DRAINAGE PERIRECTAL ABSCESS: SHX1804

## 2021-05-31 LAB — CBC WITH DIFFERENTIAL/PLATELET
Abs Immature Granulocytes: 0.03 10*3/uL (ref 0.00–0.07)
Basophils Absolute: 0 10*3/uL (ref 0.0–0.1)
Basophils Relative: 0 %
Eosinophils Absolute: 0.1 10*3/uL (ref 0.0–0.5)
Eosinophils Relative: 1 %
HCT: 48.2 % (ref 39.0–52.0)
Hemoglobin: 16.6 g/dL (ref 13.0–17.0)
Immature Granulocytes: 0 %
Lymphocytes Relative: 20 %
Lymphs Abs: 1.5 10*3/uL (ref 0.7–4.0)
MCH: 30 pg (ref 26.0–34.0)
MCHC: 34.4 g/dL (ref 30.0–36.0)
MCV: 87 fL (ref 80.0–100.0)
Monocytes Absolute: 0.6 10*3/uL (ref 0.1–1.0)
Monocytes Relative: 8 %
Neutro Abs: 5.3 10*3/uL (ref 1.7–7.7)
Neutrophils Relative %: 71 %
Platelets: 248 10*3/uL (ref 150–400)
RBC: 5.54 MIL/uL (ref 4.22–5.81)
RDW: 11.7 % (ref 11.5–15.5)
WBC: 7.5 10*3/uL (ref 4.0–10.5)
nRBC: 0 % (ref 0.0–0.2)

## 2021-05-31 LAB — BASIC METABOLIC PANEL
Anion gap: 7 (ref 5–15)
BUN: 19 mg/dL (ref 6–20)
CO2: 26 mmol/L (ref 22–32)
Calcium: 9.4 mg/dL (ref 8.9–10.3)
Chloride: 102 mmol/L (ref 98–111)
Creatinine, Ser: 0.8 mg/dL (ref 0.61–1.24)
GFR, Estimated: >60 mL/min (ref >60)
Glucose, Bld: 398 mg/dL — ABNORMAL HIGH (ref 70–99)
Potassium: 4.4 mmol/L (ref 3.5–5.1)
Sodium: 135 mmol/L (ref 135–145)

## 2021-05-31 LAB — RESP PANEL BY RT-PCR (FLU A&B, COVID) ARPGX2
Influenza A by PCR: NEGATIVE
Influenza B by PCR: NEGATIVE
SARS Coronavirus 2 by RT PCR: NEGATIVE

## 2021-05-31 LAB — CBG MONITORING, ED
Glucose-Capillary: 395 mg/dL — ABNORMAL HIGH (ref 70–99)
Glucose-Capillary: 239 mg/dL — ABNORMAL HIGH (ref 70–99)

## 2021-05-31 LAB — GLUCOSE, CAPILLARY
Glucose-Capillary: 237 mg/dL — ABNORMAL HIGH (ref 70–99)
Glucose-Capillary: 215 mg/dL — ABNORMAL HIGH (ref 70–99)

## 2021-05-31 SURGERY — INCISION AND DRAINAGE, ABSCESS, PERIRECTAL
Anesthesia: General | Site: Rectum

## 2021-05-31 MED ORDER — METRONIDAZOLE 500 MG/100ML IV SOLN
500.0000 mg | Freq: Two times a day (BID) | INTRAVENOUS | Status: DC
  Administered 2021-06-01 – 2021-06-02 (×3): 500 mg via INTRAVENOUS
  Filled 2021-05-31 (×3): qty 100

## 2021-05-31 MED ORDER — FENTANYL CITRATE (PF) 100 MCG/2ML IJ SOLN
INTRAMUSCULAR | Status: AC
  Filled 2021-05-31: qty 2

## 2021-05-31 MED ORDER — BUPIVACAINE-EPINEPHRINE 0.5% -1:200000 IJ SOLN
INTRAMUSCULAR | Status: DC | PRN
  Administered 2021-05-31: 30 mL

## 2021-05-31 MED ORDER — FENTANYL CITRATE (PF) 100 MCG/2ML IJ SOLN
INTRAMUSCULAR | Status: DC | PRN
  Administered 2021-05-31: 100 ug via INTRAVENOUS

## 2021-05-31 MED ORDER — 0.9 % SODIUM CHLORIDE (POUR BTL) OPTIME
TOPICAL | Status: DC | PRN
  Administered 2021-05-31: 1000 mL

## 2021-05-31 MED ORDER — DIPHENHYDRAMINE HCL 50 MG/ML IJ SOLN: 25.0000 mg | Freq: Four times a day (QID) | INTRAMUSCULAR | Status: DC | PRN

## 2021-05-31 MED ORDER — ACETAMINOPHEN 500 MG PO TABS
1000.0000 mg | ORAL_TABLET | Freq: Four times a day (QID) | ORAL | Status: DC
  Administered 2021-06-01 – 2021-06-02 (×7): 1000 mg via ORAL
  Filled 2021-05-31 (×7): qty 2

## 2021-05-31 MED ORDER — KETOROLAC TROMETHAMINE 15 MG/ML IJ SOLN
15.0000 mg | Freq: Four times a day (QID) | INTRAMUSCULAR | Status: AC
  Administered 2021-06-01: 15 mg via INTRAVENOUS
  Filled 2021-05-31: qty 1

## 2021-05-31 MED ORDER — DIPHENHYDRAMINE HCL 25 MG PO CAPS: 25.0000 mg | ORAL_CAPSULE | Freq: Four times a day (QID) | ORAL | Status: DC | PRN

## 2021-05-31 MED ORDER — ONDANSETRON HCL 4 MG/2ML IJ SOLN
4.0000 mg | Freq: Once | INTRAMUSCULAR | Status: AC
  Administered 2021-05-31: 4 mg via INTRAVENOUS
  Filled 2021-05-31: qty 2

## 2021-05-31 MED ORDER — ACETAMINOPHEN 500 MG PO TABS: 1000.0000 mg | ORAL_TABLET | Freq: Once | ORAL | Status: DC

## 2021-05-31 MED ORDER — PROMETHAZINE HCL 25 MG/ML IJ SOLN: 6.2500 mg | INTRAMUSCULAR | Status: DC | PRN

## 2021-05-31 MED ORDER — ONDANSETRON HCL 4 MG/2ML IJ SOLN
4.0000 mg | Freq: Four times a day (QID) | INTRAMUSCULAR | Status: DC | PRN
  Administered 2021-06-01: 15:00:00 4 mg via INTRAVENOUS
  Filled 2021-05-31: qty 2

## 2021-05-31 MED ORDER — LIDOCAINE HCL (PF) 2 % IJ SOLN
INTRAMUSCULAR | Status: AC
  Filled 2021-05-31: qty 5

## 2021-05-31 MED ORDER — MIDAZOLAM HCL 2 MG/2ML IJ SOLN
INTRAMUSCULAR | Status: AC
  Filled 2021-05-31: qty 2

## 2021-05-31 MED ORDER — OXYCODONE HCL 5 MG/5ML PO SOLN: 5.0000 mg | Freq: Once | ORAL | Status: DC | PRN

## 2021-05-31 MED ORDER — IOHEXOL 350 MG/ML SOLN
80.0000 mL | Freq: Once | INTRAVENOUS | Status: AC | PRN
  Administered 2021-05-31: 80 mL via INTRAVENOUS

## 2021-05-31 MED ORDER — PROPOFOL 10 MG/ML IV BOLUS
INTRAVENOUS | Status: AC
  Filled 2021-05-31: qty 20

## 2021-05-31 MED ORDER — HYDROCODONE-ACETAMINOPHEN 5-325 MG PO TABS: 1.0000 | ORAL_TABLET | ORAL | Status: DC | PRN

## 2021-05-31 MED ORDER — KETOROLAC TROMETHAMINE 15 MG/ML IJ SOLN
15.0000 mg | Freq: Four times a day (QID) | INTRAMUSCULAR | Status: DC | PRN
  Administered 2021-06-01: 09:00:00 15 mg via INTRAVENOUS
  Filled 2021-05-31: qty 1

## 2021-05-31 MED ORDER — FENTANYL CITRATE PF 50 MCG/ML IJ SOSY: 25.0000 ug | PREFILLED_SYRINGE | INTRAMUSCULAR | Status: DC | PRN

## 2021-05-31 MED ORDER — ONDANSETRON HCL 4 MG/2ML IJ SOLN
INTRAMUSCULAR | Status: AC
  Filled 2021-05-31: qty 2

## 2021-05-31 MED ORDER — PROPOFOL 10 MG/ML IV BOLUS
INTRAVENOUS | Status: DC | PRN
  Administered 2021-05-31: 200 mg via INTRAVENOUS

## 2021-05-31 MED ORDER — SODIUM CHLORIDE 0.9 % IV BOLUS
1000.0000 mL | Freq: Once | INTRAVENOUS | Status: AC
  Administered 2021-05-31: 1000 mL via INTRAVENOUS

## 2021-05-31 MED ORDER — SODIUM CHLORIDE 0.9 % IV SOLN
2.0000 g | INTRAVENOUS | Status: DC
  Administered 2021-06-01 (×2): 2 g via INTRAVENOUS
  Filled 2021-05-31 (×2): qty 20

## 2021-05-31 MED ORDER — MIDAZOLAM HCL 5 MG/5ML IJ SOLN
INTRAMUSCULAR | Status: DC | PRN
  Administered 2021-05-31: 2 mg via INTRAVENOUS

## 2021-05-31 MED ORDER — HYDROMORPHONE HCL 1 MG/ML IJ SOLN
0.5000 mg | Freq: Once | INTRAMUSCULAR | Status: AC
Start: 2021-05-31 — End: 2021-05-31
  Administered 2021-05-31: 0.5 mg via INTRAVENOUS
  Filled 2021-05-31: qty 1

## 2021-05-31 MED ORDER — KCL IN DEXTROSE-NACL 20-5-0.45 MEQ/L-%-% IV SOLN
INTRAVENOUS | Status: DC
  Filled 2021-05-31: qty 1000

## 2021-05-31 MED ORDER — ROCURONIUM BROMIDE 10 MG/ML (PF) SYRINGE
PREFILLED_SYRINGE | INTRAVENOUS | Status: AC
  Filled 2021-05-31: qty 10

## 2021-05-31 MED ORDER — INSULIN ASPART 100 UNIT/ML IJ SOLN: 5.0000 [IU] | Freq: Once | INTRAMUSCULAR | Status: AC

## 2021-05-31 MED ORDER — INSULIN ASPART 100 UNIT/ML IJ SOLN
INTRAMUSCULAR | Status: AC
  Administered 2021-05-31: 5 [IU] via SUBCUTANEOUS
  Filled 2021-05-31: qty 1

## 2021-05-31 MED ORDER — BUPIVACAINE-EPINEPHRINE (PF) 0.5% -1:200000 IJ SOLN
INTRAMUSCULAR | Status: AC
  Filled 2021-05-31: qty 30

## 2021-05-31 MED ORDER — OXYCODONE HCL 5 MG PO TABS: 5.0000 mg | ORAL_TABLET | Freq: Once | ORAL | Status: DC | PRN

## 2021-05-31 MED ORDER — ENOXAPARIN SODIUM 40 MG/0.4ML IJ SOSY
40.0000 mg | PREFILLED_SYRINGE | INTRAMUSCULAR | Status: DC
  Administered 2021-06-01 – 2021-06-02 (×2): 40 mg via SUBCUTANEOUS
  Filled 2021-05-31 (×2): qty 0.4

## 2021-05-31 MED ORDER — SUCCINYLCHOLINE CHLORIDE 200 MG/10ML IV SOSY
PREFILLED_SYRINGE | INTRAVENOUS | Status: DC | PRN
  Administered 2021-05-31: 120 mg via INTRAVENOUS

## 2021-05-31 MED ORDER — LACTATED RINGERS IV SOLN: INTRAVENOUS | Status: DC

## 2021-05-31 MED ORDER — ONDANSETRON 4 MG PO TBDP
4.0000 mg | ORAL_TABLET | Freq: Four times a day (QID) | ORAL | Status: DC | PRN
  Administered 2021-06-02: 4 mg via ORAL
  Filled 2021-05-31: qty 1

## 2021-05-31 MED ORDER — LACTATED RINGERS IV SOLN: INTRAVENOUS | Status: DC | PRN

## 2021-05-31 MED ORDER — DOCUSATE SODIUM 100 MG PO CAPS
100.0000 mg | ORAL_CAPSULE | Freq: Two times a day (BID) | ORAL | Status: DC
  Administered 2021-06-01 – 2021-06-02 (×3): 100 mg via ORAL
  Filled 2021-05-31 (×3): qty 1

## 2021-05-31 MED ORDER — TRAMADOL HCL 50 MG PO TABS: 50.0000 mg | ORAL_TABLET | Freq: Four times a day (QID) | ORAL | Status: DC | PRN

## 2021-05-31 MED ORDER — ONDANSETRON HCL 4 MG/2ML IJ SOLN
INTRAMUSCULAR | Status: DC | PRN
  Administered 2021-05-31: 4 mg via INTRAVENOUS

## 2021-05-31 MED ORDER — INSULIN ASPART 100 UNIT/ML IJ SOLN
0.0000 [IU] | Freq: Three times a day (TID) | INTRAMUSCULAR | Status: DC
  Administered 2021-06-01: 08:00:00 8 [IU] via SUBCUTANEOUS
  Administered 2021-06-01: 14:00:00 5 [IU] via SUBCUTANEOUS
  Administered 2021-06-01: 18:00:00 3 [IU] via SUBCUTANEOUS
  Administered 2021-06-02 (×2): 5 [IU] via SUBCUTANEOUS

## 2021-05-31 MED ORDER — LIDOCAINE 2% (20 MG/ML) 5 ML SYRINGE
INTRAMUSCULAR | Status: DC | PRN
  Administered 2021-05-31: 70 mg via INTRAVENOUS

## 2021-05-31 MED ORDER — ACETAMINOPHEN 500 MG PO TABS
ORAL_TABLET | ORAL | Status: AC
  Filled 2021-05-31: qty 2

## 2021-05-31 SURGICAL SUPPLY — 18 items
BAG COUNTER SPONGE SURGICOUNT (BAG) IMPLANT
BLADE SURG 15 STRL LF DISP TIS (BLADE) ×1 IMPLANT
BLADE SURG 15 STRL SS (BLADE) ×1
DRAIN PENROSE 0.25X18 (DRAIN) IMPLANT
DRSG PAD ABDOMINAL 8X10 ST (GAUZE/BANDAGES/DRESSINGS) ×2 IMPLANT
GAUZE 4X4 16PLY ~~LOC~~+RFID DBL (SPONGE) ×2 IMPLANT
GAUZE SPONGE 4X4 12PLY STRL (GAUZE/BANDAGES/DRESSINGS) ×2 IMPLANT
GLOVE SURG ENC MOIS LTX SZ6.5 (GLOVE) ×4 IMPLANT
GLOVE SURG UNDER POLY LF SZ7 (GLOVE) ×2 IMPLANT
GOWN STRL REUS W/TWL XL LVL3 (GOWN DISPOSABLE) ×4 IMPLANT
PACK LITHOTOMY IV (CUSTOM PROCEDURE TRAY) ×2 IMPLANT
PENCIL SMOKE EVACUATOR (MISCELLANEOUS) IMPLANT
SURGILUBE 2OZ TUBE FLIPTOP (MISCELLANEOUS) ×2 IMPLANT
SUT SILK 2 0 (SUTURE)
SUT SILK 2-0 18XBRD TIE 12 (SUTURE) IMPLANT
TOWEL OR 17X26 10 PK STRL BLUE (TOWEL DISPOSABLE) ×2 IMPLANT
TOWEL OR NON WOVEN STRL DISP B (DISPOSABLE) IMPLANT
TRAY CATH INTERMITTENT SS 16FR (CATHETERS) ×2 IMPLANT

## 2021-05-31 NOTE — Anesthesia Procedure Notes (Signed)
Procedure Name: Intubation Date/Time: 05/31/2021 9:02 PM Performed by: Montel Clock, CRNA Pre-anesthesia Checklist: Patient identified, Emergency Drugs available, Suction available, Patient being monitored and Timeout performed Patient Re-evaluated:Patient Re-evaluated prior to induction Oxygen Delivery Method: Circle system utilized Preoxygenation: Pre-oxygenation with 100% oxygen Induction Type: IV induction, Rapid sequence and Cricoid Pressure applied Laryngoscope Size: Mac and 3 Grade View: Grade I Tube type: Oral Tube size: 7.5 mm Number of attempts: 1 Airway Equipment and Method: Stylet Placement Confirmation: ETT inserted through vocal cords under direct vision, positive ETCO2 and breath sounds checked- equal and bilateral Secured at: 22 cm Tube secured with: Tape Dental Injury: Teeth and Oropharynx as per pre-operative assessment

## 2021-05-31 NOTE — Op Note (Signed)
05/31/2021  9:18 PM  PATIENT:  Gregory Campos  35 y.o. male  Patient Care Team: [name], MD as PCP - General (Internal Medicine)  PRE-OPERATIVE DIAGNOSIS:  INTERSPHINCTERIC ANAL ABCESS  POST-OPERATIVE DIAGNOSIS:  INTERSPHINCTERIC ANAL ABCESS  PROCEDURE:  INTERNAL INCISION AND DRAINAGE INTERSPHINCTERIC ANAL ABSCESS   Surgeon(s): Romie Levee, MD  ASSISTANT: none   ANESTHESIA:   local and general  SPECIMEN:  No Specimen  DISPOSITION OF SPECIMEN:  N/A  COUNTS:  YES  PLAN OF CARE: Admit for overnight observation  PATIENT DISPOSITION:  PACU - hemodynamically stable.  INDICATION: 35 y.o. M with DM and R posterior intersphincteric abscess   OR FINDINGS: R posterior intersphincteric abscess  DESCRIPTION: the patient was identified in the preoperative holding area and taken to the OR where they were laid on the operating room table.  General anesthesia was induced without difficulty. The patient was then positioned in lithotomy position.  The patient was then prepped and draped in usual sterile fashion.  SCDs were noted to be in place prior to the initiation of anesthesia. A surgical timeout was performed indicating the correct patient, procedure, positioning and need for preoperative antibiotics.  A rectal block was performed using Marcaine with epinephrine.    I began with a digital rectal exam.  A mass could be palpated in the right posterior intersphincteric space.  It was bulging into the anal canal.  I then placed a Hill-Ferguson anoscope into the anal canal and evaluated this completely.  There was no other pathology noted.  There was no sign of abscess or inflammation in the ischio rectal space.  I made a small puncture wound at the dentate line using a right angle clamp.  This allowed for drainage of the perirectal abscess which was approximately 2 cm in diameter.  Once the entire abscess was drained, I debrided the area using blunt dissection.  Making sure to  keep the sphincter complex intact.  I then packed the anal canal with a sponge to assist with hemostasis.  During the procedure we noted urine leaking from the patient's urethra.  There was concern for urinary retention.  A Foley catheter was inserted under sterile conditions and over a liter of urine was removed from the patient's bladder.  The catheter was then removed and the patient was awakened from anesthesia and sent to the postanesthesia care unit in stable condition.  All counts were correct per operating room staff.

## 2021-05-31 NOTE — ED Provider Notes (Signed)
[facility] DEPT Provider Note   CSN: [id number] Arrival date & time: 05/31/21  1205     History Chief Complaint  Patient presents with   Hyperglycemia   Hemorrhoids    Gregory Campos is a 35 y.o. male.  Patient complains of rectal pain.  He saw his primary care doctor and he was treated for hemorrhoid.  He states the pain seems to be getting worse.  Patient has diabetes  The history is provided by the patient and medical records. No language interpreter was used.  Abdominal Pain Pain location: Rectal pain. Pain quality: aching   Pain radiates to:  Does not radiate Pain severity:  Severe Onset quality:  Sudden Timing:  Constant Progression:  Worsening Chronicity:  New Context: not alcohol use   Relieved by:  Nothing Ineffective treatments:  None tried Associated symptoms: no chest pain, no chills, no cough, no diarrhea, no fatigue and no hematuria       Past Medical History:  Diagnosis Date   Diabetes mellitus without complication ([place])    age 49 yo at diagnosis    Patient Active Problem List   Diagnosis Date Noted   Mixed dyslipidemia 10/02/2013   Type II or unspecified type diabetes mellitus without mention of complication, uncontrolled 10/02/2013    History reviewed. No pertinent surgical history.     History reviewed. No pertinent family history.  Social History   Tobacco Use   Smoking status: Never   Smokeless tobacco: Current    Types: Chew  Vaping Use   Vaping Use: Never used  Substance Use Topics   Alcohol use: Yes    Alcohol/week: 24.0 standard drinks    Types: 24 Cans of beer per week    Comment: Reports last drink was last week   Drug use: No    Home Medications Prior to Admission medications   Medication Sig Start Date End Date Taking? Authorizing Provider  metFORMIN (GLUCOPHAGE) 500 MG tablet Take 1 tablet (500 mg total) by mouth 2 (two) times daily with a meal. 08/16/19 09/15/19  Curatolo, Adam, DO     Allergies    Patient has no known allergies.  Review of Systems   Review of Systems  Constitutional:  Negative for appetite change, chills and fatigue.  HENT:  Negative for congestion, ear discharge and sinus pressure.   Eyes:  Negative for discharge.  Respiratory:  Negative for cough.   Cardiovascular:  Negative for chest pain.  Gastrointestinal:  Positive for abdominal pain. Negative for diarrhea.       Rectal pain  Genitourinary:  Negative for frequency and hematuria.  Musculoskeletal:  Negative for back pain.  Skin:  Negative for rash.  Neurological:  Negative for seizures and headaches.  Psychiatric/Behavioral:  Negative for hallucinations.    Physical Exam Updated Vital Signs BP (!) 146/99   Pulse 73   Temp 97.6 F (36.4 C) (Oral)   Resp 16   Ht 5\' 8"  (1.727 m)   Wt 68 kg   SpO2 97%   BMI 22.81 kg/m   Physical Exam Vitals and nursing note reviewed.  Constitutional:      Appearance: He is well-developed.  HENT:     Head: Normocephalic.     Nose: Nose normal.  Eyes:     General: No scleral icterus.    Conjunctiva/sclera: Conjunctivae normal.  Neck:     Thyroid: No thyromegaly.  Cardiovascular:     Rate and Rhythm: Normal rate and regular rhythm.  Heart sounds: No murmur heard.   No friction rub. No gallop.  Pulmonary:     Breath sounds: No stridor. No wheezing or rales.  Chest:     Chest wall: No tenderness.  Abdominal:     General: There is no distension.     Tenderness: There is no abdominal tenderness. There is no rebound.  Genitourinary:    Comments: Patient has swelling inside his rectum and severe pain. Musculoskeletal:        General: Normal range of motion.     Cervical back: Neck supple.  Lymphadenopathy:     Cervical: No cervical adenopathy.  Skin:    Findings: No erythema or rash.  Neurological:     Mental Status: He is alert and oriented to person, place, and time.     Motor: No abnormal muscle tone.     Coordination:  Coordination normal.  Psychiatric:        Behavior: Behavior normal.    ED Results / Procedures / Treatments   Labs (all labs ordered are listed, but only abnormal results are displayed) Labs Reviewed  BASIC METABOLIC PANEL - Abnormal; Notable for the following components:      Result Value   Glucose, Bld 398 (*)    All other components within normal limits  CBG MONITORING, ED - Abnormal; Notable for the following components:   Glucose-Capillary 395 (*)    All other components within normal limits  CBG MONITORING, ED - Abnormal; Notable for the following components:   Glucose-Capillary 239 (*)    All other components within normal limits  RESP PANEL BY RT-PCR (FLU A&B, COVID) ARPGX2  CBC WITH DIFFERENTIAL/PLATELET    EKG None  Radiology CT ABDOMEN PELVIS W CONTRAST  Result Date: 05/31/2021 CLINICAL DATA:  Abdominal abscess/infection suspected. EXAM: CT ABDOMEN AND PELVIS WITH CONTRAST TECHNIQUE: Multidetector CT imaging of the abdomen and pelvis was performed using the standard protocol following bolus administration of intravenous contrast. CONTRAST:  90mL OMNIPAQUE IOHEXOL 350 MG/ML SOLN COMPARISON:  None. FINDINGS: Lower chest: No acute abnormality. Hepatobiliary: There is diffuse fatty infiltration of the liver parenchyma. No focal liver abnormality is seen. No gallstones, gallbladder wall thickening, or biliary dilatation. Pancreas: Unremarkable. No pancreatic ductal dilatation or surrounding inflammatory changes. Spleen: Normal in size without focal abnormality. Adrenals/Urinary Tract: Adrenal glands are unremarkable. Kidneys are normal, without renal calculi, focal lesion, or hydronephrosis. Urinary bladder is markedly distended and otherwise normal in appearance. Stomach/Bowel: Stomach is within normal limits. Appendix appears normal. No evidence of bowel wall thickening, distention, or inflammatory changes. Vascular/Lymphatic: No significant vascular findings are present. No  enlarged abdominal or pelvic lymph nodes. Reproductive: Prostate is unremarkable. Other: No abdominal wall hernia or abnormality. No abdominopelvic ascites. Musculoskeletal: A 3.1 cm x 1.5 cm x 2.0 cm posterior perirectal abscess is seen on the right. IMPRESSION: 1. 3.1 cm x 1.5 cm x 2.0 cm right-sided posterior perirectal abscess. 2. Hepatic steatosis. Electronically Signed   By: Virgina Norfolk M.D.   On: 05/31/2021 18:48    Procedures Procedures   Medications Ordered in ED Medications  sodium chloride 0.9 % bolus 1,000 mL (1,000 mLs Intravenous New Bag/Given 05/31/21 1822)  HYDROmorphone (DILAUDID) injection 0.5 mg (0.5 mg Intravenous Given 05/31/21 1823)  ondansetron (ZOFRAN) injection 4 mg (4 mg Intravenous Given 05/31/21 1823)  iohexol (OMNIPAQUE) 350 MG/ML injection 80 mL (80 mLs Intravenous Contrast Given 05/31/21 1832)    ED Course  I have reviewed the triage vital signs and the nursing notes.  Pertinent labs & imaging results that were available during my care of the patient were reviewed by me and considered in my medical decision making (see chart for details).    MDM Rules/Calculators/A&P                           CT scan shows perirectal abscess.  I spoke with general surgery and they will take the patient to the operating room for the abscess Final Clinical Impression(s) / ED Diagnoses Final diagnoses:  Perirectal abscess    Rx / DC Orders ED Discharge Orders     None        Bethann Berkshire, MD 05/31/21 1930

## 2021-05-31 NOTE — H&P (Signed)
CC: rectal pain  Requesting provider: Dr Estell Harpin  HPI: Gregory Campos is an 35 y.o. male who is here for rectal pain. This started ~1 week ago.  He has been treated for hemorrhoids with a cream but his symptoms have worsened.  He denies fevers or diarrhea  Past Medical History:  Diagnosis Date   Diabetes mellitus without complication [facility])    age 75 yo at diagnosis    History reviewed. No pertinent surgical history.  History reviewed. No pertinent family history.  Social:  reports that he has never smoked. His smokeless tobacco use includes chew. He reports current alcohol use of about 24.0 standard drinks per week. He reports that he does not use drugs.  Allergies: No Known Allergies  Medications: I have reviewed the patient's current medications.  Results for orders placed or performed during the hospital encounter of 05/31/21 (from the past 48 hour(s))  CBG monitoring, ED     Status: Abnormal   Collection Time: 05/31/21 12:13 PM  Result Value Ref Range   Glucose-Capillary 395 (H) 70 - 99 mg/dL    Comment: Glucose reference range applies only to samples taken after fasting for at least 8 hours.  CBC with Differential     Status: None   Collection Time: 05/31/21 12:31 PM  Result Value Ref Range   WBC 7.5 4.0 - 10.5 K/uL   RBC 5.54 4.22 - 5.81 MIL/uL   Hemoglobin 16.6 13.0 - 17.0 g/dL   HCT 74.9 44.9 - 67.5 %   MCV 87.0 80.0 - 100.0 fL   MCH 30.0 26.0 - 34.0 pg   MCHC 34.4 30.0 - 36.0 g/dL   RDW 91.6 38.4 - 66.5 %   Platelets 248 150 - 400 K/uL   nRBC 0.0 0.0 - 0.2 %   Neutrophils Relative % 71 %   Neutro Abs 5.3 1.7 - 7.7 K/uL   Lymphocytes Relative 20 %   Lymphs Abs 1.5 0.7 - 4.0 K/uL   Monocytes Relative 8 %   Monocytes Absolute 0.6 0.1 - 1.0 K/uL   Eosinophils Relative 1 %   Eosinophils Absolute 0.1 0.0 - 0.5 K/uL   Basophils Relative 0 %   Basophils Absolute 0.0 0.0 - 0.1 K/uL   Immature Granulocytes 0 %   Abs Immature Granulocytes 0.03 0.00 - 0.07  K/uL    Comment: Performed at [facility], [address], [place], [place] [address]  Basic metabolic panel     Status: Abnormal   Collection Time: 05/31/21 12:31 PM  Result Value Ref Range   Sodium 135 135 - 145 mmol/L   Potassium 4.4 3.5 - 5.1 mmol/L   Chloride 102 98 - 111 mmol/L   CO2 26 22 - 32 mmol/L   Glucose, Bld 398 (H) 70 - 99 mg/dL    Comment: Glucose reference range applies only to samples taken after fasting for at least 8 hours.   BUN 19 6 - 20 mg/dL   Creatinine, Ser 0.17 0.61 - 1.24 mg/dL   Calcium 9.4 8.9 - 79.3 mg/dL   GFR, Estimated >90 >30 mL/min    Comment: (NOTE) Calculated using the CKD-EPI Creatinine Equation (2021)    Anion gap 7 5 - 15    Comment: Performed at [facility], [address], [place], [place] [address]  CBG monitoring, ED     Status: Abnormal   Collection Time: 05/31/21  7:16 PM  Result Value Ref Range   Glucose-Capillary 239 (H) 70 -  99 mg/dL    Comment: Glucose reference range applies only to samples taken after fasting for at least 8 hours.    CT ABDOMEN PELVIS W CONTRAST  Result Date: 05/31/2021 CLINICAL DATA:  Abdominal abscess/infection suspected. EXAM: CT ABDOMEN AND PELVIS WITH CONTRAST TECHNIQUE: Multidetector CT imaging of the abdomen and pelvis was performed using the standard protocol following bolus administration of intravenous contrast. CONTRAST:  39mL OMNIPAQUE IOHEXOL 350 MG/ML SOLN COMPARISON:  None. FINDINGS: Lower chest: No acute abnormality. Hepatobiliary: There is diffuse fatty infiltration of the liver parenchyma. No focal liver abnormality is seen. No gallstones, gallbladder wall thickening, or biliary dilatation. Pancreas: Unremarkable. No pancreatic ductal dilatation or surrounding inflammatory changes. Spleen: Normal in size without focal abnormality. Adrenals/Urinary Tract: Adrenal glands are unremarkable. Kidneys are normal, without renal calculi, focal lesion, or hydronephrosis.  Urinary bladder is markedly distended and otherwise normal in appearance. Stomach/Bowel: Stomach is within normal limits. Appendix appears normal. No evidence of bowel wall thickening, distention, or inflammatory changes. Vascular/Lymphatic: No significant vascular findings are present. No enlarged abdominal or pelvic lymph nodes. Reproductive: Prostate is unremarkable. Other: No abdominal wall hernia or abnormality. No abdominopelvic ascites. Musculoskeletal: A 3.1 cm x 1.5 cm x 2.0 cm posterior perirectal abscess is seen on the right. IMPRESSION: 1. 3.1 cm x 1.5 cm x 2.0 cm right-sided posterior perirectal abscess. 2. Hepatic steatosis. Electronically Signed   By: Virgina Norfolk M.D.   On: 05/31/2021 18:48    ROS - all of the below systems have been reviewed with the patient and positives are indicated with bold text General: chills, fever or night sweats Eyes: blurry vision or double vision ENT: epistaxis or sore throat Hematologic/Lymphatic: bleeding problems, blood clots or swollen lymph nodes Endocrine: temperature intolerance or unexpected weight changes Breast: new or changing breast lumps or nipple discharge Resp: cough, shortness of breath, or wheezing CV: chest pain or dyspnea on exertion GI: as per HPI GU: dysuria, trouble voiding, or hematuria Neuro: TIA or stroke symptoms    PE Blood pressure (!) 146/99, pulse 73, temperature 97.6 F (36.4 C), temperature source Oral, resp. rate 16, height 5\' 8"  (1.727 m), weight 68 kg, SpO2 97 %. Constitutional: NAD; conversant; no deformities Eyes: Moist conjunctiva; no lid lag; anicteric; PERRL Neck: Trachea midline; no thyromegaly Lungs: Normal respiratory effort CV: RRR GI: Abd soft MSK: Normal range of motion of extremities; no clubbing/cyanosis Psychiatric: Appropriate affect; alert and oriented x3  Results for orders placed or performed during the hospital encounter of 05/31/21 (from the past 48 hour(s))  CBG monitoring, ED      Status: Abnormal   Collection Time: 05/31/21 12:13 PM  Result Value Ref Range   Glucose-Capillary 395 (H) 70 - 99 mg/dL    Comment: Glucose reference range applies only to samples taken after fasting for at least 8 hours.  CBC with Differential     Status: None   Collection Time: 05/31/21 12:31 PM  Result Value Ref Range   WBC 7.5 4.0 - 10.5 K/uL   RBC 5.54 4.22 - 5.81 MIL/uL   Hemoglobin 16.6 13.0 - 17.0 g/dL   HCT 48.2 39.0 - 52.0 %   MCV 87.0 80.0 - 100.0 fL   MCH 30.0 26.0 - 34.0 pg   MCHC 34.4 30.0 - 36.0 g/dL   RDW 11.7 11.5 - 15.5 %   Platelets 248 150 - 400 K/uL   nRBC 0.0 0.0 - 0.2 %   Neutrophils Relative % 71 %   Neutro Abs 5.3  1.7 - 7.7 K/uL   Lymphocytes Relative 20 %   Lymphs Abs 1.5 0.7 - 4.0 K/uL   Monocytes Relative 8 %   Monocytes Absolute 0.6 0.1 - 1.0 K/uL   Eosinophils Relative 1 %   Eosinophils Absolute 0.1 0.0 - 0.5 K/uL   Basophils Relative 0 %   Basophils Absolute 0.0 0.0 - 0.1 K/uL   Immature Granulocytes 0 %   Abs Immature Granulocytes 0.03 0.00 - 0.07 K/uL    Comment: Performed at [facility], [place] [address], [place], [place] [id number]  Basic metabolic panel     Status: Abnormal   Collection Time: 05/31/21 12:31 PM  Result Value Ref Range   Sodium 135 135 - 145 mmol/L   Potassium 4.4 3.5 - 5.1 mmol/L   Chloride 102 98 - 111 mmol/L   CO2 26 22 - 32 mmol/L   Glucose, Bld 398 (H) 70 - 99 mg/dL    Comment: Glucose reference range applies only to samples taken after fasting for at least 8 hours.   BUN 19 6 - 20 mg/dL   Creatinine, Ser 0.80 0.61 - 1.24 mg/dL   Calcium 9.4 8.9 - 10.3 mg/dL   GFR, Estimated >60 >60 mL/min    Comment: (NOTE) Calculated using the CKD-EPI Creatinine Equation (2021)    Anion gap 7 5 - 15    Comment: Performed at [facility], [place] [address], [place], [place] [address]  CBG monitoring, ED     Status: Abnormal   Collection Time: 05/31/21  7:16 PM  Result Value Ref Range    Glucose-Capillary 239 (H) 70 - 99 mg/dL    Comment: Glucose reference range applies only to samples taken after fasting for at least 8 hours.    CT ABDOMEN PELVIS W CONTRAST  Result Date: 05/31/2021 CLINICAL DATA:  Abdominal abscess/infection suspected. EXAM: CT ABDOMEN AND PELVIS WITH CONTRAST TECHNIQUE: Multidetector CT imaging of the abdomen and pelvis was performed using the standard protocol following bolus administration of intravenous contrast. CONTRAST:  36mL OMNIPAQUE IOHEXOL 350 MG/ML SOLN COMPARISON:  None. FINDINGS: Lower chest: No acute abnormality. Hepatobiliary: There is diffuse fatty infiltration of the liver parenchyma. No focal liver abnormality is seen. No gallstones, gallbladder wall thickening, or biliary dilatation. Pancreas: Unremarkable. No pancreatic ductal dilatation or surrounding inflammatory changes. Spleen: Normal in size without focal abnormality. Adrenals/Urinary Tract: Adrenal glands are unremarkable. Kidneys are normal, without renal calculi, focal lesion, or hydronephrosis. Urinary bladder is markedly distended and otherwise normal in appearance. Stomach/Bowel: Stomach is within normal limits. Appendix appears normal. No evidence of bowel wall thickening, distention, or inflammatory changes. Vascular/Lymphatic: No significant vascular findings are present. No enlarged abdominal or pelvic lymph nodes. Reproductive: Prostate is unremarkable. Other: No abdominal wall hernia or abnormality. No abdominopelvic ascites. Musculoskeletal: A 3.1 cm x 1.5 cm x 2.0 cm posterior perirectal abscess is seen on the right. IMPRESSION: 1. 3.1 cm x 1.5 cm x 2.0 cm right-sided posterior perirectal abscess. 2. Hepatic steatosis. Electronically Signed   By: Virgina Norfolk M.D.   On: 05/31/2021 18:48     A/P: Gregory Campos is an 35 y.o. male with intersphincteric abscess.  I have recommended internal drainage in the OR.  Risks including  bleeding, pain and recurrence have been  discussed with the patient.  All questions were answered.  Pt will need antibiotics after surgery due to his uncontrolled DM.     Rosario Adie, MD  [facility]  Total time of evaluation, examination, counseling and implementing medical decisions was 40 mins, straightforward decision making.

## 2021-05-31 NOTE — ED Provider Notes (Signed)
Emergency Medicine Provider Triage Evaluation Note  Gregory Campos , a 35 y.o. male  was evaluated in triage.  Pt complains of rectal pain that started about 1 week ago.  Review of Systems  Positive: Rectal pain Negative: chills  Physical Exam  BP 101/77 (BP Location: Left Arm)   Pulse 86   Temp 97.6 F (36.4 C) (Oral)   Resp 18   Ht 5\' 8"  (1.727 m)   Wt 68 kg   SpO2 94%   BMI 22.81 kg/m  Gen:   Awake, no distress   Resp:  Normal effort  MSK:   Moves extremities without difficulty   Medical Decision Making  Medically screening exam initiated at 12:21 PM.  Appropriate orders placed.  Gregory Campos was informed that the remainder of the evaluation will be completed by another provider, this initial triage assessment does not replace that evaluation, and the importance of remaining in the ED until their evaluation is complete.     Celso Amy, PA-C 05/31/21 1223    06/02/21, MD 05/31/21 [phone]

## 2021-05-31 NOTE — Anesthesia Postprocedure Evaluation (Signed)
Anesthesia Post Note  Patient: Gregory Campos  Procedure(s) Performed: IRRIGATION AND DEBRIDEMENT PERIRECTAL ABSCESS (Rectum)     Patient location during evaluation: PACU Anesthesia Type: General Level of consciousness: awake and alert Pain management: pain level controlled Vital Signs Assessment: post-procedure vital signs reviewed and stable Respiratory status: spontaneous breathing, nonlabored ventilation and respiratory function stable Cardiovascular status: blood pressure returned to baseline and stable Postop Assessment: no apparent nausea or vomiting Anesthetic complications: no   No notable events documented.  Last Vitals:  Vitals:   05/31/21 2145 05/31/21 2147  BP: 114/73   Pulse: 81 82  Resp: 11 14  Temp:    SpO2: 100% 100%    Last Pain:  Vitals:   05/31/21 2145  TempSrc:   PainSc: 0-No pain                 Beryle Lathe

## 2021-05-31 NOTE — Anesthesia Preprocedure Evaluation (Addendum)
Anesthesia Evaluation  Patient identified by MRN, date of birth, ID band Patient awake    Reviewed: Allergy & Precautions, NPO status , Patient's Chart, lab work & pertinent test results  History of Anesthesia Complications Negative for: history of anesthetic complications  Airway Mallampati: III  TM Distance: >3 FB Neck ROM: Full    Dental  (+) Dental Advisory Given, Chipped, Loose   Pulmonary neg pulmonary ROS,    Pulmonary exam normal        Cardiovascular negative cardio ROS Normal cardiovascular exam     Neuro/Psych negative neurological ROS  negative psych ROS   GI/Hepatic negative GI ROS, (+)     substance abuse  alcohol use,   Endo/Other  diabetes, Poorly Controlled, Type 2, Oral Hypoglycemic Agents BS 239 (398 earlier today)   Renal/GU negative Renal ROS     Musculoskeletal negative musculoskeletal ROS (+)   Abdominal   Peds  Hematology negative hematology ROS (+)   Anesthesia Other Findings   Reproductive/Obstetrics                            Anesthesia Physical Anesthesia Plan  ASA: 3 and emergent  Anesthesia Plan: General   Post-op Pain Management:    Induction: Intravenous and Rapid sequence  PONV Risk Score and Plan: 2 and Treatment may vary due to age or medical condition, Ondansetron and Midazolam  Airway Management Planned: Oral ETT  Additional Equipment: None  Intra-op Plan:   Post-operative Plan: Extubation in OR  Informed Consent: I have reviewed the patients History and Physical, chart, labs and discussed the procedure including the risks, benefits and alternatives for the proposed anesthesia with the patient or authorized representative who has indicated his/her understanding and acceptance.     Dental advisory given  Plan Discussed with: CRNA, Anesthesiologist and Surgeon  Anesthesia Plan Comments:        Anesthesia Quick Evaluation

## 2021-05-31 NOTE — ED Triage Notes (Signed)
Pt presents with c/o hyperglycemia and hemorrhoids. Pt was recently seen for the hemorrhoids and given meds but says that they are not working and he is still in pain. Pt has a hx of diabetes, CBG 466 for EMS. Pt does take Metformin at home. Pt has a 20 g IV in left forearm, bolus given en route by EMS.

## 2021-05-31 NOTE — Transfer of Care (Signed)
Immediate Anesthesia Transfer of Care Note  Patient: Gregory Campos  Procedure(s) Performed: IRRIGATION AND DEBRIDEMENT PERIRECTAL ABSCESS (Rectum)  Patient Location: PACU  Anesthesia Type:General  Level of Consciousness: drowsy and patient cooperative  Airway & Oxygen Therapy: Patient Spontanous Breathing and Patient connected to face mask oxygen  Post-op Assessment: Report given to RN and Post -op Vital signs reviewed and stable  Post vital signs: Reviewed and stable  Last Vitals:  Vitals Value Taken Time  BP 104/65 05/31/21 2140  Temp 36.3 C 05/31/21 2140  Pulse 80 05/31/21 2144  Resp 11 05/31/21 2144  SpO2 100 % 05/31/21 2144  Vitals shown include unvalidated device data.  Last Pain:  Vitals:   05/31/21 1951  TempSrc: Oral  PainSc:          Complications: No notable events documented.

## 2021-05-31 NOTE — OR Nursing (Signed)
In and out cath with of greenish clear colored urine

## 2021-06-01 ENCOUNTER — Encounter (HOSPITAL_COMMUNITY): Payer: Self-pay | Admitting: General Surgery

## 2021-06-01 LAB — GLUCOSE, CAPILLARY
Glucose-Capillary: 227 mg/dL — ABNORMAL HIGH (ref 70–99)
Glucose-Capillary: 281 mg/dL — ABNORMAL HIGH (ref 70–99)
Glucose-Capillary: 232 mg/dL — ABNORMAL HIGH (ref 70–99)
Glucose-Capillary: 214 mg/dL — ABNORMAL HIGH (ref 70–99)
Glucose-Capillary: 164 mg/dL — ABNORMAL HIGH (ref 70–99)
Glucose-Capillary: 205 mg/dL — ABNORMAL HIGH (ref 70–99)

## 2021-06-01 LAB — URINALYSIS, ROUTINE W REFLEX MICROSCOPIC
Bacteria, UA: NONE SEEN
Bilirubin Urine: NEGATIVE
Glucose, UA: >=500 mg/dL — AB
Ketones, ur: 5 mg/dL — AB
Leukocytes,Ua: NEGATIVE
Nitrite: NEGATIVE
Protein, ur: NEGATIVE mg/dL
Specific Gravity, Urine: 1.01 (ref 1.005–1.030)
pH: 6 (ref 5.0–8.0)

## 2021-06-01 LAB — HEMOGLOBIN A1C
Hgb A1c MFr Bld: 11.7 % — ABNORMAL HIGH (ref 4.8–5.6)
Mean Plasma Glucose: 289.09 mg/dL

## 2021-06-01 MED ORDER — LIVING WELL WITH DIABETES BOOK
Freq: Once | Status: AC
  Filled 2021-06-01 (×2): qty 1

## 2021-06-01 MED ORDER — INSULIN GLARGINE-YFGN 100 UNIT/ML ~~LOC~~ SOLN
14.0000 [IU] | Freq: Every day | SUBCUTANEOUS | Status: DC
  Administered 2021-06-01 – 2021-06-02 (×2): 14 [IU] via SUBCUTANEOUS
  Filled 2021-06-01 (×2): qty 0.14

## 2021-06-01 MED ORDER — POLYETHYLENE GLYCOL 3350 17 G PO PACK
17.0000 g | PACK | Freq: Every day | ORAL | Status: DC
  Administered 2021-06-01: 14:00:00 17 g via ORAL
  Filled 2021-06-01: qty 1

## 2021-06-01 MED ORDER — INSULIN STARTER KIT- PEN NEEDLES (ENGLISH)
1.0000 | Freq: Once | Status: DC
  Filled 2021-06-01 (×2): qty 1

## 2021-06-01 MED ORDER — TAMSULOSIN HCL 0.4 MG PO CAPS
0.4000 mg | ORAL_CAPSULE | Freq: Every day | ORAL | Status: DC
  Administered 2021-06-01 – 2021-06-02 (×2): 0.4 mg via ORAL
  Filled 2021-06-01 (×2): qty 1

## 2021-06-01 MED ORDER — HYDROCODONE-ACETAMINOPHEN 5-325 MG PO TABS: 1.0000 | ORAL_TABLET | ORAL | Status: DC | PRN

## 2021-06-01 NOTE — Progress Notes (Signed)
Patient ID: Gregory Campos, male   DOB: Nov 18, 1985, 35 y.o.   MRN: [id number] [facility] Surgery Progress Note  1 Day Post-Op  Subjective: CC-  Feeling better since surgery.  Objective: Vital signs in last 24 hours: Temp:  [97.4 F (36.3 C)-98.7 F (37.1 C)] 98.7 F (37.1 C) (11/17 0900) Pulse Rate:  [67-86] 70 (11/17 0900) Resp:  [11-20] 16 (11/17 0900) BP: (91-168)/(65-107) 128/92 (11/17 0900) SpO2:  [94 %-100 %] 98 % (11/17 0900) Weight:  [68 kg] 68 kg (11/16 1215) Last BM Date: 05/31/21  Intake/Output from previous day: 11/16 0701 - 11/17 0700 In: 2063.2 [P.O.:150; I.V.:713.2; IV Piggyback:1200] Out: 1510 [Urine:1500; Blood:10] Intake/Output this shift: No intake/output data recorded.  PE: Gen:  Alert, NAD, pleasant Pulm: rate and effort normal Abd: Soft, NT/ND GU: purulent and bloody drainage noted gauze in mesh underwear  Lab Results:  Recent Labs    05/31/21 1231  WBC 7.5  HGB 16.6  HCT 48.2  PLT 248   BMET Recent Labs    05/31/21 1231  NA 135  K 4.4  CL 102  CO2 26  GLUCOSE 398*  BUN 19  CREATININE 0.80  CALCIUM 9.4   PT/INR No results for input(s): LABPROT, INR in the last 72 hours. CMP     Component Value Date/Time   NA 135 05/31/2021 1231   K 4.4 05/31/2021 1231   CL 102 05/31/2021 1231   CO2 26 05/31/2021 1231   GLUCOSE 398 (H) 05/31/2021 1231   BUN 19 05/31/2021 1231   CREATININE 0.80 05/31/2021 1231   CREATININE 0.68 10/01/2013 0947   CALCIUM 9.4 05/31/2021 1231   PROT 6.8 02/13/2021 2026   ALBUMIN 3.8 02/13/2021 2026   AST 35 02/13/2021 2026   ALT 58 (H) 02/13/2021 2026   ALKPHOS 54 02/13/2021 2026   BILITOT 1.2 02/13/2021 2026   GFRNONAA >60 05/31/2021 1231   GFRAA >60 08/16/2019 1437   Lipase     Component Value Date/Time   LIPASE 22 08/16/2019 1437       Studies/Results: CT ABDOMEN PELVIS W CONTRAST  Result Date: 05/31/2021 CLINICAL DATA:  Abdominal abscess/infection suspected. EXAM: CT ABDOMEN AND  PELVIS WITH CONTRAST TECHNIQUE: Multidetector CT imaging of the abdomen and pelvis was performed using the standard protocol following bolus administration of intravenous contrast. CONTRAST:  24mL OMNIPAQUE IOHEXOL 350 MG/ML SOLN COMPARISON:  None. FINDINGS: Lower chest: No acute abnormality. Hepatobiliary: There is diffuse fatty infiltration of the liver parenchyma. No focal liver abnormality is seen. No gallstones, gallbladder wall thickening, or biliary dilatation. Pancreas: Unremarkable. No pancreatic ductal dilatation or surrounding inflammatory changes. Spleen: Normal in size without focal abnormality. Adrenals/Urinary Tract: Adrenal glands are unremarkable. Kidneys are normal, without renal calculi, focal lesion, or hydronephrosis. Urinary bladder is markedly distended and otherwise normal in appearance. Stomach/Bowel: Stomach is within normal limits. Appendix appears normal. No evidence of bowel wall thickening, distention, or inflammatory changes. Vascular/Lymphatic: No significant vascular findings are present. No enlarged abdominal or pelvic lymph nodes. Reproductive: Prostate is unremarkable. Other: No abdominal wall hernia or abnormality. No abdominopelvic ascites. Musculoskeletal: A 3.1 cm x 1.5 cm x 2.0 cm posterior perirectal abscess is seen on the right. IMPRESSION: 1. 3.1 cm x 1.5 cm x 2.0 cm right-sided posterior perirectal abscess. 2. Hepatic steatosis. Electronically Signed   By: Aram Candela M.D.   On: 05/31/2021 18:48    Anti-infectives: Anti-infectives (From admission, onward)    Start     Dose/Rate Route Frequency Ordered Stop   06/01/21  0000  cefTRIAXone (ROCEPHIN) 2 g in sodium chloride 0.9 % 100 mL IVPB       See Hyperspace for full Linked Orders Report.   2 g 200 mL/hr over 30 Minutes Intravenous Every 24 hours 05/31/21 2319 06/05/21 2359   06/01/21 0000  metroNIDAZOLE (FLAGYL) IVPB 500 mg       See Hyperspace for full Linked Orders Report.   500 mg 100 mL/hr over 60  Minutes Intravenous Every 12 hours 05/31/21 2319 06/05/21 2359        Assessment/Plan Intersphincteric anal abscess POD#1 s/p INTERNAL INCISION AND DRAINAGE INTERSPHINCTERIC ANAL ABSCESS 11/16 Dr. Maisie Fus - change dry gauze in mesh underwear PRN saturation - ok to shower - continue stool softeners - continue IV abx during admission, plan 1 week oral abx at discharge - mobilize  ID - rocephin/flagyl 11/17>> FEN - d/c IVF, CM diet VTE - lovenox Foley - I&O x1 in OR with 1500cc, has not voided, bladder scan shows 368cc >> mobilize, if unable to void repeat bladder scan and will need I&O cath if >400cc  DM - A1c 11.7. SSI. Will ask diabetes coordinator to see. Needs better glucose control and f/u with PCP  Dispo: Possible discharge later today once mobilizing, pain controlled, if able to void, and after DM coordinator eval.   LOS: 0 days    Franne Forts, [facility] 06/01/2021, 10:26 AM Please see Amion for pager number during day hours 7:00am-4:30pm

## 2021-06-01 NOTE — Plan of Care (Signed)

## 2021-06-01 NOTE — Progress Notes (Signed)
Inpatient Diabetes Program Recommendations  AACE/ADA: New Consensus Statement on Inpatient Glycemic Control (2015)  Target Ranges:  Prepandial:   less than 140 mg/dL      Peak postprandial:   less than 180 mg/dL (1-2 hours)      Critically ill patients:  140 - 180 mg/dL   Lab Results  Component Value Date   GLUCAP 281 (H) 06/01/2021   HGBA1C 11.7 (H) 06/01/2021    Review of Glycemic Control  Diabetes history: DM2 Outpatient Diabetes medications: Metformin 500 mg. bid Current orders for Inpatient glycemic control: Novolog correction 0-15 units tid  Inpatient Diabetes Program Recommendations:   -Add Semglee 14 units qd (0.2 units/kg x 68 kg) Needs on discharge: -Lantus pen [id number] -Insulin Pen needles [id number] -CBG Meter & Supplies [phone]  Spoke with patient regarding A1c 11.7 (average blood glucose 289 over the past 2-3 months). Explained what an A1C is, basic pathophysiology of DM Type 2, basic home care, basic diabetes diet nutrition principles, importance of checking CBGs and maintaining good CBG control to prevent long-term and short-term complications. Reviewed signs and symptoms of hyperglycemia and hypoglycemia and how to treat hypoglycemia at home. Also reviewed blood sugar goals at home.  RNs to provide ongoing basic DM education at bedside with this patient. Have ordered educational booklet, insulin starter kit, and DM videos. Have also placed RD consult for DM diet education for this patient.   Patient has been on insulin pens in the past and has not taken for approximately 6-7 months. Educated patient on insulin pen use at home. Reviewed contents of insulin flexpen starter kit. Reviewed all steps if insulin pen including attachment of needle, 2-unit air shot, dialing up dose, giving injection, removing needle, disposal of sharps, storage of unused insulin, disposal of insulin etc.   Reviewed basic plate method with patient and patient states he currently eats a lot of brown  rice, but reviewed to limit rice to no more than a cup of rice per meal which would equal approximately 45 gms or 3 servings. Reviewed the need to also limit juices, sweets, sugary drinks, and carbohydrates. Texted video to patient's phone for review per request of patient.  Thank you, Nani Gasser. Marciano Mundt, RN, MSN, CDE  Diabetes Coordinator Inpatient Glycemic Control Team Team Pager [phone] (8am-5pm) 06/01/2021 11:10 AM

## 2021-06-02 LAB — CBC
HCT: 45.2 % (ref 39.0–52.0)
Hemoglobin: 15.8 g/dL (ref 13.0–17.0)
MCH: 29.8 pg (ref 26.0–34.0)
MCHC: 35 g/dL (ref 30.0–36.0)
MCV: 85.3 fL (ref 80.0–100.0)
Platelets: 232 10*3/uL (ref 150–400)
RBC: 5.3 MIL/uL (ref 4.22–5.81)
RDW: 11.5 % (ref 11.5–15.5)
WBC: 5.4 10*3/uL (ref 4.0–10.5)
nRBC: 0 % (ref 0.0–0.2)

## 2021-06-02 LAB — BASIC METABOLIC PANEL
Anion gap: 9 (ref 5–15)
BUN: 10 mg/dL (ref 6–20)
CO2: 23 mmol/L (ref 22–32)
Calcium: 8.4 mg/dL — ABNORMAL LOW (ref 8.9–10.3)
Chloride: 101 mmol/L (ref 98–111)
Creatinine, Ser: 0.71 mg/dL (ref 0.61–1.24)
GFR, Estimated: >60 mL/min (ref >60)
Glucose, Bld: 216 mg/dL — ABNORMAL HIGH (ref 70–99)
Potassium: 3.6 mmol/L (ref 3.5–5.1)
Sodium: 133 mmol/L — ABNORMAL LOW (ref 135–145)

## 2021-06-02 LAB — GLUCOSE, CAPILLARY
Glucose-Capillary: 205 mg/dL — ABNORMAL HIGH (ref 70–99)
Glucose-Capillary: 220 mg/dL — ABNORMAL HIGH (ref 70–99)

## 2021-06-02 LAB — MAGNESIUM: Magnesium: 1.7 mg/dL (ref 1.7–2.4)

## 2021-06-02 MED ORDER — ACETAMINOPHEN 500 MG PO TABS: 1000.0000 mg | ORAL_TABLET | Freq: Four times a day (QID) | ORAL | 0 refills | Status: AC | PRN

## 2021-06-02 MED ORDER — TAMSULOSIN HCL 0.4 MG PO CAPS: 0.4000 mg | ORAL_CAPSULE | Freq: Every day | ORAL | 0 refills | Status: AC

## 2021-06-02 MED ORDER — POLYETHYLENE GLYCOL 3350 17 G PO PACK: 17.0000 g | PACK | Freq: Every day | ORAL | Status: DC | PRN

## 2021-06-02 MED ORDER — SIMETHICONE 80 MG PO CHEW: 80.0000 mg | CHEWABLE_TABLET | Freq: Four times a day (QID) | ORAL | Status: DC | PRN

## 2021-06-02 MED ORDER — AMOXICILLIN-POT CLAVULANATE 875-125 MG PO TABS
1.0000 | ORAL_TABLET | Freq: Two times a day (BID) | ORAL | Status: DC
  Administered 2021-06-02: 1 via ORAL
  Filled 2021-06-02: qty 1

## 2021-06-02 MED ORDER — LANTUS SOLOSTAR 100 UNIT/ML ~~LOC~~ SOPN
14.0000 [IU] | PEN_INJECTOR | Freq: Every day | SUBCUTANEOUS | 1 refills | Status: AC
Start: 2021-06-02 — End: ?

## 2021-06-02 MED ORDER — INSULIN PEN NEEDLE 32G X 4 MM MISC: 1 refills | Status: AC

## 2021-06-02 MED ORDER — OXYCODONE HCL 5 MG PO TABS
5.0000 mg | ORAL_TABLET | ORAL | Status: DC | PRN
Start: 2021-06-02 — End: 2021-06-02

## 2021-06-02 MED ORDER — BLOOD GLUCOSE METER KIT: PACK | 0 refills | Status: AC

## 2021-06-02 MED ORDER — RAMIPRIL 5 MG PO CAPS
5.0000 mg | ORAL_CAPSULE | Freq: Every day | ORAL | Status: DC
  Administered 2021-06-02: 5 mg via ORAL
  Filled 2021-06-02: qty 1

## 2021-06-02 MED ORDER — PROCHLORPERAZINE EDISYLATE 10 MG/2ML IJ SOLN
10.0000 mg | Freq: Four times a day (QID) | INTRAMUSCULAR | Status: DC | PRN
  Administered 2021-06-02: 10 mg via INTRAVENOUS
  Filled 2021-06-02: qty 2

## 2021-06-02 MED ORDER — OXYCODONE HCL 5 MG PO TABS
5.0000 mg | ORAL_TABLET | Freq: Four times a day (QID) | ORAL | 0 refills | Status: AC | PRN
Start: 2021-06-02 — End: ?

## 2021-06-02 MED ORDER — AMOXICILLIN-POT CLAVULANATE 875-125 MG PO TABS: 1.0000 | ORAL_TABLET | Freq: Two times a day (BID) | ORAL | 0 refills | Status: AC

## 2021-06-02 MED ORDER — INSULIN GLARGINE-YFGN 100 UNIT/ML ~~LOC~~ SOLN: 17.0000 [IU] | Freq: Every day | SUBCUTANEOUS | Status: DC

## 2021-06-02 NOTE — Progress Notes (Signed)
Inpatient Diabetes Program Recommendations  AACE/ADA: New Consensus Statement on Inpatient Glycemic Control   Target Ranges:  Prepandial:   less than 140 mg/dL      Peak postprandial:   less than 180 mg/dL (1-2 hours)      Critically ill patients:  140 - 180 mg/dL    Latest Reference Range & Units 06/01/21 08:03 06/01/21 11:32 06/01/21 13:24 06/01/21 16:59 06/01/21 21:47 06/02/21 07:29  Glucose-Capillary 70 - 99 mg/dL 960 (H) 454 (H) 098 (H) 164 (H) 205 (H) 205 (H)  (H): Data is abnormally high  Review of Glycemic Control  Diabetes history: DM2 Outpatient Diabetes medications: Metformin 500 mg BID Current orders for Inpatient glycemic control: Semglee 14 units daily, Novolog 0-15 units TID with meals  Inpatient Diabetes Program Recommendations:    Insulin: Please consider increasing Semglee to 17 units daily.  DM outpatient needs: AT time of discharge, please provide Rx for: Lantus SoloStar pen [phone]), Insulin Pen needles (#[id number]), CBG Meter & Supplies [phone]).  Thanks, Gregory Penner, RN, MSN, CDE Diabetes Coordinator Inpatient Diabetes Program [phone] (Team Pager from 8am to 5pm)

## 2021-06-02 NOTE — Discharge Instructions (Signed)
Please see attached information handouts on anorectal abscesses and sitz baths Please take antibiotics to completion.  Please do not drive or operate heavy machinery while taking Narcotic pain medication (Oxycodone) Please call your primary care provider to arrange follow up   Please call our office or get help right away if you: You develop a fever You have persistent nausea or develop vomiting Have severe or increasing pain. Have swelling in the affected area that suddenly gets worse. Have a large increase in bleeding or passing of pus. You have the inability to urinate Any other concerns

## 2021-06-02 NOTE — Plan of Care (Signed)
  Problem: Activity: Goal: Risk for activity intolerance will decrease Outcome: Progressing   Problem: Pain Managment: Goal: General experience of comfort will improve Outcome: Progressing   Problem: Safety: Goal: Ability to remain free from injury will improve Outcome: Progressing   

## 2021-06-02 NOTE — Discharge Summary (Addendum)
Patient ID: Gregory Campos [id number] 23-Mar-1986 35 y.o.  Admit date: 05/31/2021 Discharge date: 06/02/2021  Admitting Diagnosis: Intersphincteric abscess  Discharge Diagnosis Patient Active Problem List   Diagnosis Date Noted   Intersphincteric abscess 05/31/2021   Mixed dyslipidemia 10/02/2013   Type II or unspecified type diabetes mellitus without mention of complication, uncontrolled 10/02/2013  Urinary retention  Consultants DM coordinator   Procedures Dr. Leighton Ruff - 28/36/6294 INTERNAL INCISION AND DRAINAGE INTERSPHINCTERIC ANAL ABSCESS  Hospital Course:  Gregory Campos is an 35 y.o. male who presented for rectal pain and was found to have intersphincteric abscess. Patient underwent above procedure. Patient tolerated the procedure well and was transferred to the floor. Diet was advanced and tolerated. DM coordinator consulted for hyperglycemia (A1c 11.7) and medication assistance. He was started on insulin during admission. During admission patient developed some urinary retention requiring I/O cath. He was started on flomax with improvement.   Patient did have some n/v on POD 2. He denied any abdominal pain was passing flatus and had a BM. He could not attribute his nausea to any specific medication or food. After changing Rocephin/Flagyl to Augmentin, patients n/v resolved. He was able to tolerate lunch without abdominal pain, n/v and did not require any further antinausea medication. His abdomen remained NT. He reports that he has been voiding his normal amount with last void 1 hour prior to my discharge evaluation. He is to continue flomax at discharge and follow up with PCP. Patient mobilizing well. Before discharge I spoke with the DM coordinator on the phone to ensure correct changes were being prescribed for DM medications. They recommending continuing home metformin in addition to prescribing insulin and discontinuing Wilder Glade in the setting of his  anorectal infection. Patient was instructed to follow up with his primary care doctor in the next 1 week for DM and urinary retention follow up. He was also arranged follow up in our office. Return precautions and discharge instructions were discussed with the patient and his wife. He declined the need for work note.   Physical Exam: Gen:  Alert, NAD, pleasant Card: Reg Pulm: CTA b/l, rate and effort normal Abd: Soft, NT/ND, +BS GU: purulent and bloody drainage noted gauze in mesh underwear  Allergies as of 06/02/2021   No Known Allergies      Medication List     STOP taking these medications    Anucort-HC 25 MG suppository Generic drug: hydrocortisone   Farxiga 10 MG Tabs tablet Generic drug: dapagliflozin propanediol       TAKE these medications    acetaminophen 500 MG tablet Commonly known as: TYLENOL Take 2 tablets (1,000 mg total) by mouth every 6 (six) hours as needed for mild pain.   amoxicillin-clavulanate 875-125 MG tablet Commonly known as: AUGMENTIN Take 1 tablet by mouth every 12 (twelve) hours.   blood glucose meter kit and supplies Dispense based on patient and insurance preference. Use up to four times daily as directed. (FOR ICD-10 E10.9, E11.9).   cyanocobalamin 1000 MCG/ML injection Commonly known as: (VITAMIN B-12) Inject 1,000 mcg into the muscle every 30 (thirty) days.   Insulin Pen Needle 32G X 4 MM Misc Use 1 box as needed   Lantus SoloStar 100 UNIT/ML Solostar Pen Generic drug: insulin glargine Inject 14 Units into the skin daily.   metFORMIN 1000 MG tablet Commonly known as: GLUCOPHAGE Take 1,000 mg by mouth 2 (two) times daily.   oxyCODONE 5 MG immediate release tablet Commonly known as: Oxy IR/ROXICODONE  Take 1 tablet (5 mg total) by mouth every 6 (six) hours as needed for severe pain.   PEG 3350 17 GM/SCOOP Powd Take 17 g by mouth in the morning and at bedtime.   ramipril 5 MG capsule Commonly known as: ALTACE Take 5 mg by  mouth daily.   rosuvastatin 5 MG tablet Commonly known as: CRESTOR Take 5 mg by mouth daily.   tamsulosin 0.4 MG Caps capsule Commonly known as: FLOMAX Take 1 capsule (0.4 mg total) by mouth daily. Start taking on: June 03, 2021          [facility], [place]. Go on 06/20/2021.   Specialty: General Surgery Why: Your appointment is 12/6 at 10:30am Please arrive 30 minutes prior to your appointment to check in and fill out paperwork. Bring photo ID and insurance information. Contact information: [address] [place] [phone]        Nolene Ebbs, MD. Schedule an appointment as soon as possible for a visit in 1 week(s).   Specialty: Internal Medicine Why: Call for follow up regarding diabetes and urinary retention Contact information: [address] [place] [address] [phone]                 Signed: Alferd Apa, [facility] 06/02/2021, 3:37 PM Please see Amion for pager number during day hours 7:00am-4:30pm

## 2021-06-02 NOTE — Progress Notes (Signed)
Patient ID: Gregory Campos, male   DOB: February 18, 1986, 35 y.o.   MRN: [id number] [facility] Progress Note  2 Days Post-Op  Subjective: CC-  Rectal pain improved. Main complaint is n/v. Denies any abdominal pain. He is passing flatus and had a BM. Cannot attribute his nausea to any specific medication or food.  Started on flomax yesterday for urinary retention. States that he is voiding and feels as though he is emptying his bladder. Denies dysuria.  Objective: Vital signs in last 24 hours: Temp:  [97.9 F (36.6 C)-98.7 F (37.1 C)] 97.9 F (36.6 C) (11/18 0531) Pulse Rate:  [63-78] 71 (11/18 0531) Resp:  [16-19] 19 (11/18 0531) BP: (127-162)/(78-96) 150/90 (11/18 0531) SpO2:  [97 %-100 %] 100 % (11/18 0531) Last BM Date: 06/01/21  Intake/Output from previous day: 11/17 0701 - 11/18 0700 In: 1993.2 [P.O.:1530; I.V.:163.2; IV Piggyback:300] Out: 2332 [Urine:2312; Emesis/NG output:20] Intake/Output this shift: Total I/O In: -  Out: 200 [Urine:200]  PE: Gen:  Alert, NAD, pleasant Pulm: rate and effort normal Abd: Soft, NT/ND, +BS GU: purulent and bloody drainage noted gauze in mesh underwear  Lab Results:  Recent Labs    05/31/21 1231 06/02/21 0835  WBC 7.5 5.4  HGB 16.6 15.8  HCT 48.2 45.2  PLT 248 232   BMET Recent Labs    05/31/21 1231 06/02/21 0835  NA 135 133*  K 4.4 3.6  CL 102 101  CO2 26 23  GLUCOSE 398* 216*  BUN 19 10  CREATININE 0.80 0.71  CALCIUM 9.4 8.4*   PT/INR No results for input(s): LABPROT, INR in the last 72 hours. CMP     Component Value Date/Time   NA 133 (L) 06/02/2021 0835   K 3.6 06/02/2021 0835   CL 101 06/02/2021 0835   CO2 23 06/02/2021 0835   GLUCOSE 216 (H) 06/02/2021 0835   BUN 10 06/02/2021 0835   CREATININE 0.71 06/02/2021 0835   CREATININE 0.68 10/01/2013 0947   CALCIUM 8.4 (L) 06/02/2021 0835   PROT 6.8 02/13/2021 2026   ALBUMIN 3.8 02/13/2021 2026   AST 35 02/13/2021 2026   ALT 58 (H)  02/13/2021 2026   ALKPHOS 54 02/13/2021 2026   BILITOT 1.2 02/13/2021 2026   GFRNONAA >60 06/02/2021 0835   GFRAA >60 08/16/2019 1437   Lipase     Component Value Date/Time   LIPASE 22 08/16/2019 1437       Studies/Results: CT ABDOMEN PELVIS W CONTRAST  Result Date: 05/31/2021 CLINICAL DATA:  Abdominal abscess/infection suspected. EXAM: CT ABDOMEN AND PELVIS WITH CONTRAST TECHNIQUE: Multidetector CT imaging of the abdomen and pelvis was performed using the standard protocol following bolus administration of intravenous contrast. CONTRAST:  57mL OMNIPAQUE IOHEXOL 350 MG/ML SOLN COMPARISON:  None. FINDINGS: Lower chest: No acute abnormality. Hepatobiliary: There is diffuse fatty infiltration of the liver parenchyma. No focal liver abnormality is seen. No gallstones, gallbladder wall thickening, or biliary dilatation. Pancreas: Unremarkable. No pancreatic ductal dilatation or surrounding inflammatory changes. Spleen: Normal in size without focal abnormality. Adrenals/Urinary Tract: Adrenal glands are unremarkable. Kidneys are normal, without renal calculi, focal lesion, or hydronephrosis. Urinary bladder is markedly distended and otherwise normal in appearance. Stomach/Bowel: Stomach is within normal limits. Appendix appears normal. No evidence of bowel wall thickening, distention, or inflammatory changes. Vascular/Lymphatic: No significant vascular findings are present. No enlarged abdominal or pelvic lymph nodes. Reproductive: Prostate is unremarkable. Other: No abdominal wall hernia or abnormality. No abdominopelvic ascites. Musculoskeletal: A 3.1 cm x 1.5 cm x  2.0 cm posterior perirectal abscess is seen on the right. IMPRESSION: 1. 3.1 cm x 1.5 cm x 2.0 cm right-sided posterior perirectal abscess. 2. Hepatic steatosis. Electronically Signed   By: Aram Candela M.D.   On: 05/31/2021 18:48    Anti-infectives: Anti-infectives (From admission, onward)    Start     Dose/Rate Route Frequency  Ordered Stop   06/02/21 1130  amoxicillin-clavulanate (AUGMENTIN) 875-125 MG per tablet 1 tablet        1 tablet Oral Every 12 hours 06/02/21 1033     06/01/21 0000  cefTRIAXone (ROCEPHIN) 2 g in sodium chloride 0.9 % 100 mL IVPB  Status:  Discontinued       See Hyperspace for full Linked Orders Report.   2 g 200 mL/hr over 30 Minutes Intravenous Every 24 hours 05/31/21 2319 06/02/21 1033   06/01/21 0000  metroNIDAZOLE (FLAGYL) IVPB 500 mg  Status:  Discontinued       See Hyperspace for full Linked Orders Report.   500 mg 100 mL/hr over 60 Minutes Intravenous Every 12 hours 05/31/21 2319 06/02/21 1033        Assessment/Plan Intersphincteric anal abscess POD#2 s/p INTERNAL INCISION AND DRAINAGE INTERSPHINCTERIC ANAL ABSCESS 11/16 Dr. Maisie Fus - change dry gauze in mesh underwear PRN saturation - ok to shower - continue stool softeners - I wonder if the flagyl is making him nauseated. Will transition to augmentin. Needs 1 week at discharge.  Urinary retention - started flomax 11/17. Will ask RN to bladder scan after he voids to ensure he is fully emptying   ID - rocephin/flagyl 11/17>>11/18, augmentin 11/18>> FEN - CM diet VTE - lovenox   DM - A1c 11.7. SSI. Appreciate diabetes coordinator assistance. Started on insulin, will need supplies at discharge and follow up with PCP   Dispo: Possible discharge later today if n/v improved and urinary retention resolved.   LOS: 0 days    Franne Forts, [facility] 06/02/2021, 10:34 AM Please see Amion for pager number during day hours 7:00am-4:30pm

## 2021-06-02 NOTE — Progress Notes (Signed)
Discharge package printed and instructions given to pt. Verbalizes understanding. 

## 2021-08-14 ENCOUNTER — Emergency Department (HOSPITAL_COMMUNITY): Payer: Medicaid Other

## 2021-08-14 ENCOUNTER — Emergency Department (HOSPITAL_COMMUNITY)
Admission: EM | Admit: 2021-08-14 | Discharge: 2021-08-15 | Disposition: A | Discharge status: Home / Self Care | Payer: Medicaid Other | Attending: Student | Admitting: Student

## 2021-08-14 ENCOUNTER — Encounter (HOSPITAL_COMMUNITY): Payer: Self-pay | Admitting: Emergency Medicine

## 2021-08-14 ENCOUNTER — Other Ambulatory Visit: Payer: Self-pay

## 2021-08-14 DIAGNOSIS — R4182 Altered mental status, unspecified: Secondary | ICD-10-CM | POA: Diagnosis present

## 2021-08-14 DIAGNOSIS — R739 Hyperglycemia, unspecified: Secondary | ICD-10-CM

## 2021-08-14 DIAGNOSIS — Z794 Long term (current) use of insulin: Secondary | ICD-10-CM | POA: Diagnosis not present

## 2021-08-14 DIAGNOSIS — F10129 Alcohol abuse with intoxication, unspecified: Secondary | ICD-10-CM | POA: Insufficient documentation

## 2021-08-14 DIAGNOSIS — E1165 Type 2 diabetes mellitus with hyperglycemia: Secondary | ICD-10-CM | POA: Diagnosis not present

## 2021-08-14 DIAGNOSIS — Z7984 Long term (current) use of oral hypoglycemic drugs: Secondary | ICD-10-CM | POA: Diagnosis not present

## 2021-08-14 DIAGNOSIS — F10920 Alcohol use, unspecified with intoxication, uncomplicated: Secondary | ICD-10-CM

## 2021-08-14 DIAGNOSIS — F17220 Nicotine dependence, chewing tobacco, uncomplicated: Secondary | ICD-10-CM | POA: Insufficient documentation

## 2021-08-14 DIAGNOSIS — Z79899 Other long term (current) drug therapy: Secondary | ICD-10-CM | POA: Diagnosis not present

## 2021-08-14 DIAGNOSIS — Y908 Blood alcohol level of 240 mg/100 ml or more: Secondary | ICD-10-CM | POA: Diagnosis not present

## 2021-08-14 LAB — COMPREHENSIVE METABOLIC PANEL
ALT: 31 U/L (ref 0–44)
AST: 29 U/L (ref 15–41)
Albumin: 4.2 g/dL (ref 3.5–5.0)
Alkaline Phosphatase: 64 U/L (ref 38–126)
Anion gap: 13 (ref 5–15)
BUN: 8 mg/dL (ref 6–20)
CO2: 19 mmol/L — ABNORMAL LOW (ref 22–32)
Calcium: 8.9 mg/dL (ref 8.9–10.3)
Chloride: 97 mmol/L — ABNORMAL LOW (ref 98–111)
Creatinine, Ser: 0.6 mg/dL — ABNORMAL LOW (ref 0.61–1.24)
GFR, Estimated: >60 mL/min (ref >60)
Glucose, Bld: 347 mg/dL — ABNORMAL HIGH (ref 70–99)
Potassium: 4.1 mmol/L (ref 3.5–5.1)
Sodium: 129 mmol/L — ABNORMAL LOW (ref 135–145)
Total Bilirubin: 1.3 mg/dL — ABNORMAL HIGH (ref 0.3–1.2)
Total Protein: 7.5 g/dL (ref 6.5–8.1)

## 2021-08-14 LAB — CBC WITH DIFFERENTIAL/PLATELET
Abs Immature Granulocytes: 0.01 10*3/uL (ref 0.00–0.07)
Basophils Absolute: 0 10*3/uL (ref 0.0–0.1)
Basophils Relative: 0 %
Eosinophils Absolute: 0.2 10*3/uL (ref 0.0–0.5)
Eosinophils Relative: 1 %
HCT: 44 % (ref 39.0–52.0)
Hemoglobin: 14.7 g/dL (ref 13.0–17.0)
Immature Granulocytes: 0 %
Lymphocytes Relative: 49 %
Lymphs Abs: 5 10*3/uL — ABNORMAL HIGH (ref 0.7–4.0)
MCH: 29.7 pg (ref 26.0–34.0)
MCHC: 33.4 g/dL (ref 30.0–36.0)
MCV: 88.9 fL (ref 80.0–100.0)
Monocytes Absolute: 0.6 10*3/uL (ref 0.1–1.0)
Monocytes Relative: 6 %
Neutro Abs: 4.5 10*3/uL (ref 1.7–7.7)
Neutrophils Relative %: 44 %
Platelets: 241 10*3/uL (ref 150–400)
RBC: 4.95 MIL/uL (ref 4.22–5.81)
RDW: 12.1 % (ref 11.5–15.5)
WBC: 10.4 10*3/uL (ref 4.0–10.5)
nRBC: 0 % (ref 0.0–0.2)

## 2021-08-14 LAB — BLOOD GAS, VENOUS
Acid-base deficit: 3.8 mmol/L — ABNORMAL HIGH (ref 0.0–2.0)
Bicarbonate: 22.1 mmol/L (ref 20.0–28.0)
O2 Saturation: 81.7 %
Patient temperature: 37
pCO2, Ven: 45.4 mmHg (ref 44.0–60.0)
pH, Ven: 7.308 (ref 7.250–7.430)
pO2, Ven: 53.4 mmHg — ABNORMAL HIGH (ref 32.0–45.0)

## 2021-08-14 LAB — SALICYLATE LEVEL: Salicylate Lvl: <7 mg/dL — ABNORMAL LOW (ref 7.0–30.0)

## 2021-08-14 LAB — ACETAMINOPHEN LEVEL: Acetaminophen (Tylenol), Serum: <10 ug/mL — ABNORMAL LOW (ref 10–30)

## 2021-08-14 LAB — ETHANOL: Alcohol, Ethyl (B): 279 mg/dL — ABNORMAL HIGH (ref <10)

## 2021-08-14 LAB — CBG MONITORING, ED: Glucose-Capillary: 329 mg/dL — ABNORMAL HIGH (ref 70–99)

## 2021-08-14 MED ORDER — LACTATED RINGERS IV BOLUS
1000.0000 mL | Freq: Once | INTRAVENOUS | Status: AC
Start: 2021-08-14 — End: 2021-08-14
  Administered 2021-08-14: 1000 mL via INTRAVENOUS

## 2021-08-14 MED ORDER — NALOXONE HCL 0.4 MG/ML IJ SOLN
INTRAMUSCULAR | Status: AC
  Administered 2021-08-14: 0.4 mg via INTRAVENOUS
  Filled 2021-08-14: qty 1

## 2021-08-14 MED ORDER — NALOXONE HCL 0.4 MG/ML IJ SOLN: 0.4000 mg | INTRAMUSCULAR | Status: DC | PRN

## 2021-08-14 NOTE — ED Notes (Signed)
Pt is standing up at the foot of his bed peeing on the floor.

## 2021-08-14 NOTE — ED Provider Notes (Signed)
[facility] DEPT Provider Note  CSN: [id number] Arrival date & time: 08/14/21 1956  Chief Complaint(s) unresponsive and Altered Mental Status  HPI Gregory Campos is a 36 y.o. male with PMH T2DM, HLD who presents the emergency department for evaluation of vomiting and altered mental status.  Obtained from EMS who states that the patient had 4 glasses of wine at dinner and then passed out at the dinner table after vomiting.  Patient arrives with stable vital signs and appears to have defecated involuntarily.  He is not responding to commands but is maintaining his airway and saturating 97% on room air.  Patient appears somnolent and smells of alcohol.  HPI  Past Medical History Past Medical History:  Diagnosis Date   Diabetes mellitus without complication ([place])    age 78 yo at diagnosis   Patient Active Problem List   Diagnosis Date Noted   Intersphincteric abscess 05/31/2021   Mixed dyslipidemia 10/02/2013   Type II or unspecified type diabetes mellitus without mention of complication, uncontrolled 10/02/2013   Home Medication(s) Prior to Admission medications   Medication Sig Start Date End Date Taking? Authorizing Provider  acetaminophen (TYLENOL) 500 MG tablet Take 2 tablets (1,000 mg total) by mouth every 6 (six) hours as needed for mild pain. 06/02/21   Maczis, Barth Kirks, PA-C  amoxicillin-clavulanate (AUGMENTIN) 875-125 MG tablet Take 1 tablet by mouth every 12 (twelve) hours. 06/02/21   Maczis, Barth Kirks, PA-C  blood glucose meter kit and supplies Dispense based on patient and insurance preference. Use up to four times daily as directed. (FOR ICD-10 E10.9, E11.9). 06/02/21   Maczis, Barth Kirks, PA-C  cyanocobalamin (,VITAMIN B-12,) 1000 MCG/ML injection Inject 1,000 mcg into the muscle every 30 (thirty) days. 05/12/21   [provider]  insulin glargine (LANTUS SOLOSTAR) 100 UNIT/ML Solostar Pen Inject 14 Units into the skin daily.  06/02/21   Maczis, Barth Kirks, PA-C  Insulin Pen Needle 32G X 4 MM MISC Use 1 box as needed 06/02/21   Maczis, Barth Kirks, PA-C  metFORMIN (GLUCOPHAGE) 1000 MG tablet Take 1,000 mg by mouth 2 (two) times daily. 05/12/21   [provider]  oxyCODONE (OXY IR/ROXICODONE) 5 MG immediate release tablet Take 1 tablet (5 mg total) by mouth every 6 (six) hours as needed for severe pain. 06/02/21   Maczis, Barth Kirks, PA-C  Polyethylene Glycol 3350 (PEG 3350) 17 GM/SCOOP POWD Take 17 g by mouth in the morning and at bedtime. 05/29/21   [provider]  ramipril (ALTACE) 5 MG capsule Take 5 mg by mouth daily. 05/12/21   [provider]  rosuvastatin (CRESTOR) 5 MG tablet Take 5 mg by mouth daily. 05/12/21   [provider]  tamsulosin (FLOMAX) 0.4 MG CAPS capsule Take 1 capsule (0.4 mg total) by mouth daily. 06/03/21   Maczis, Barth Kirks, PA-C  Past Surgical History Past Surgical History:  Procedure Laterality Date   INCISION AND DRAINAGE PERIRECTAL ABSCESS N/A 05/31/2021   Procedure: IRRIGATION AND DEBRIDEMENT PERIRECTAL ABSCESS;  Surgeon: Leighton Ruff, MD;  Location: [facility] ORS;  Service: General;  Laterality: N/A;   Family History History reviewed. No pertinent family history.  Social History Social History   Tobacco Use   Smoking status: Never   Smokeless tobacco: Current    Types: Chew  Vaping Use   Vaping Use: Never used  Substance Use Topics   Alcohol use: Yes    Alcohol/week: 24.0 standard drinks    Types: 24 Cans of beer per week    Comment: Reports last drink was last week   Drug use: No   Allergies Patient has no known allergies.  Review of Systems Review of Systems  Unable to perform ROS: Patient unresponsive   Physical Exam Vital Signs  I have reviewed the triage vital signs BP 116/79    Pulse 80    Temp 97.7 F  (36.5 C) (Oral)    Resp 16    SpO2 97%   Physical Exam Vitals and nursing note reviewed.  Constitutional:      General: He is not in acute distress.    Appearance: He is well-developed. He is ill-appearing.  HENT:     Head: Normocephalic and atraumatic.  Eyes:     Conjunctiva/sclera: Conjunctivae normal.  Cardiovascular:     Rate and Rhythm: Normal rate and regular rhythm.     Heart sounds: No murmur heard. Pulmonary:     Effort: Pulmonary effort is normal. No respiratory distress.     Breath sounds: Normal breath sounds.  Abdominal:     Palpations: Abdomen is soft.     Tenderness: There is no abdominal tenderness.  Musculoskeletal:        General: No swelling.     Cervical back: Neck supple.  Skin:    General: Skin is warm and dry.     Capillary Refill: Capillary refill takes less than 2 seconds.  Neurological:     Mental Status: He is alert. He is disoriented.     Comments: Somnolent    ED Results and Treatments Labs (all labs ordered are listed, but only abnormal results are displayed) Labs Reviewed  COMPREHENSIVE METABOLIC PANEL - Abnormal; Notable for the following components:      Result Value   Sodium 129 (*)    Chloride 97 (*)    CO2 19 (*)    Glucose, Bld 347 (*)    Creatinine, Ser 0.60 (*)    Total Bilirubin 1.3 (*)    All other components within normal limits  CBC WITH DIFFERENTIAL/PLATELET - Abnormal; Notable for the following components:   Lymphs Abs 5.0 (*)    All other components within normal limits  SALICYLATE LEVEL - Abnormal; Notable for the following components:   Salicylate Lvl <7.3 (*)    All other components within normal limits  ACETAMINOPHEN LEVEL - Abnormal; Notable for the following components:   Acetaminophen (Tylenol), Serum <10 (*)    All other components within normal limits  ETHANOL - Abnormal; Notable for the following components:   Alcohol, Ethyl (B) 279 (*)    All other components within normal limits  BLOOD GAS, VENOUS -  Abnormal; Notable for the following components:   pO2, Ven 53.4 (*)    Acid-base deficit 3.8 (*)    All other components within normal limits  CBG MONITORING, ED - Abnormal; Notable for  the following components:   Glucose-Capillary 329 (*)    All other components within normal limits  RAPID URINE DRUG SCREEN, HOSP PERFORMED  BETA-HYDROXYBUTYRIC ACID                                                                                                                          Radiology CT Head Wo Contrast  Result Date: 08/14/2021 CLINICAL DATA:  Altered mental status, nontraumatic (Ped 0-17y) EXAM: CT HEAD WITHOUT CONTRAST TECHNIQUE: Contiguous axial images were obtained from the base of the skull through the vertex without intravenous contrast. RADIATION DOSE REDUCTION: This exam was performed according to the departmental dose-optimization program which includes automated exposure control, adjustment of the mA and/or kV according to patient size and/or use of iterative reconstruction technique. COMPARISON:  CT head 02/13/2021. FINDINGS: Brain: No evidence of acute large vascular territory infarction, acute hemorrhage, hydrocephalus, extra-axial collection or mass lesion/mass effect. Vascular: No hyperdense vessel identified. Skull: No acute fracture Sinuses/Orbits: Visualized sinuses are clear.  Unremarkable orbits. Other: No mastoid effusions. IMPRESSION: No evidence of acute intracranial abnormality. Electronically Signed   By: Margaretha Sheffield M.D.   On: 08/14/2021 20:27   DG Chest Portable 1 View  Result Date: 08/14/2021 CLINICAL DATA:  Unresponsive, rule out pneumonia. EXAM: PORTABLE CHEST 1 VIEW COMPARISON:  None. FINDINGS: The heart size and mediastinal contours are within normal limits. Lung volumes are low. No consolidation, effusion, or pneumothorax. No acute osseous abnormality. IMPRESSION: No active disease. Electronically Signed   By: Brett Fairy M.D.   On: 08/14/2021 20:20    Pertinent  labs & imaging results that were available during my care of the patient were reviewed by me and considered in my medical decision making (see MDM for details).  Medications Ordered in ED Medications  naloxone (NARCAN) injection 0.4 mg (0.4 mg Intravenous Given 08/14/21 2010)  lactated ringers bolus 1,000 mL (0 mLs Intravenous Stopped 08/14/21 2212)                                                                                                                                     Procedures Procedures  (including critical care time)  Medical Decision Making / ED Course   This patient presents to the ED for concern of somnolence and abnormal behavior, this involves an extensive number of treatment options, and is a complaint that carries with it a high risk  of complications and morbidity.  The differential diagnosis includes alcohol intoxication, polysubstance abuse, intracranial hemorrhage, dystonia, medication side effect  MDM: Patient seen emergency department for evaluation of altered mental status and unresponsive behavior.  Physical exam reveals a somnolent appearing patient that is protecting his airway and smells of alcohol.  Laboratory evaluation reveals pseudohyponatremia of 129 with a glucose of 347 and a CO2 of 19.  VBG 7.3 and I have low suspicion for DKA at this time.  Patient's alcohol level significantly elevated at 279.  Narcan administered with no effect.  CT head with no intracranial pathology.  Chest x-ray unremarkable.  He was given a liter of fluids and observed for approximately 4 hours here in the emergency department.  On reevaluation, patient is alert, able to ambulate without difficulty.  Patient presentation consistent with alcohol intoxication and he was discharged in the care of his family.   Additional history obtained: -Additional history obtained from family members -External records from outside source obtained and reviewed including: Chart review including  previous notes, labs, imaging, consultation notes   Lab Tests: -I ordered, reviewed, and interpreted labs.   The pertinent results include:   Labs Reviewed  COMPREHENSIVE METABOLIC PANEL - Abnormal; Notable for the following components:      Result Value   Sodium 129 (*)    Chloride 97 (*)    CO2 19 (*)    Glucose, Bld 347 (*)    Creatinine, Ser 0.60 (*)    Total Bilirubin 1.3 (*)    All other components within normal limits  CBC WITH DIFFERENTIAL/PLATELET - Abnormal; Notable for the following components:   Lymphs Abs 5.0 (*)    All other components within normal limits  SALICYLATE LEVEL - Abnormal; Notable for the following components:   Salicylate Lvl <1.6 (*)    All other components within normal limits  ACETAMINOPHEN LEVEL - Abnormal; Notable for the following components:   Acetaminophen (Tylenol), Serum <10 (*)    All other components within normal limits  ETHANOL - Abnormal; Notable for the following components:   Alcohol, Ethyl (B) 279 (*)    All other components within normal limits  BLOOD GAS, VENOUS - Abnormal; Notable for the following components:   pO2, Ven 53.4 (*)    Acid-base deficit 3.8 (*)    All other components within normal limits  CBG MONITORING, ED - Abnormal; Notable for the following components:   Glucose-Capillary 329 (*)    All other components within normal limits  RAPID URINE DRUG SCREEN, HOSP PERFORMED  BETA-HYDROXYBUTYRIC ACID     Imaging Studies ordered: I ordered imaging studies including CTH, CXR I independently visualized and interpreted imaging. I agree with the radiologist interpretation   Medicines ordered and prescription drug management: Meds ordered this encounter  Medications   naloxone (NARCAN) injection 0.4 mg   naloxone (NARCAN) 0.4 MG/ML injection    Willeen Cass K: cabinet override   lactated ringers bolus 1,000 mL    -I have reviewed the patients home medicines and have made adjustments as needed  Critical  interventions none   Cardiac Monitoring: The patient was maintained on a cardiac monitor.  I personally viewed and interpreted the cardiac monitored which showed an underlying rhythm of: NSR  Social Determinants of Health:  Factors impacting patients care include: english second language, alcohol use   Reevaluation: After the interventions noted above, I reevaluated the patient and found that they have :improved  Co morbidities that complicate the patient evaluation  Past  Medical History:  Diagnosis Date   Diabetes mellitus without complication [facility])    age 19 yo at diagnosis      Dispostion: I considered admission for this patient, but the patient is able to ambulate without difficulty and his symptoms have resolved.  Patient safe for outpatient follow-up.     Final Clinical Impression(s) / ED Diagnoses Final diagnoses:  Alcoholic intoxication without complication ([place])  Hyperglycemia     @PCDICTATION @    Teressa Lower, MD 08/15/21 [phone]

## 2021-08-14 NOTE — ED Triage Notes (Addendum)
Pt coming from home- dtr said he had 4 glasses of wine. Airway intact- pupils sluggish. Responsive to pain only. Pt has had large BM and was incontinent. Family mentioned he may have been off a drug he was supposed to be taking.

## 2021-08-15 MED FILL — Medication: Qty: 1 | Status: AC

## 2021-08-15 NOTE — ED Notes (Signed)
Pt is walking around room and following commands to sit down. Scrubs given and pt walked to wheelchair. Brother in Sports coach

## 2021-09-11 DIAGNOSIS — K219 Gastro-esophageal reflux disease without esophagitis: Secondary | ICD-10-CM | POA: Insufficient documentation

## 2021-09-11 DIAGNOSIS — F331 Major depressive disorder, recurrent, moderate: Secondary | ICD-10-CM | POA: Insufficient documentation

## 2021-10-05 ENCOUNTER — Emergency Department (HOSPITAL_COMMUNITY)
Admission: EM | Admit: 2021-10-05 | Discharge: 2021-10-06 | Disposition: A | Discharge status: Home / Self Care | Payer: Medicaid Other | Attending: Emergency Medicine | Admitting: Emergency Medicine

## 2021-10-05 ENCOUNTER — Other Ambulatory Visit: Payer: Self-pay

## 2021-10-05 ENCOUNTER — Encounter (HOSPITAL_COMMUNITY): Payer: Self-pay | Admitting: Emergency Medicine

## 2021-10-05 ENCOUNTER — Emergency Department (HOSPITAL_COMMUNITY): Payer: Medicaid Other

## 2021-10-05 DIAGNOSIS — R112 Nausea with vomiting, unspecified: Secondary | ICD-10-CM | POA: Diagnosis present

## 2021-10-05 DIAGNOSIS — R197 Diarrhea, unspecified: Secondary | ICD-10-CM | POA: Insufficient documentation

## 2021-10-05 DIAGNOSIS — Z794 Long term (current) use of insulin: Secondary | ICD-10-CM | POA: Diagnosis not present

## 2021-10-05 DIAGNOSIS — R109 Unspecified abdominal pain: Secondary | ICD-10-CM | POA: Insufficient documentation

## 2021-10-05 DIAGNOSIS — E119 Type 2 diabetes mellitus without complications: Secondary | ICD-10-CM | POA: Insufficient documentation

## 2021-10-05 DIAGNOSIS — Z7984 Long term (current) use of oral hypoglycemic drugs: Secondary | ICD-10-CM | POA: Insufficient documentation

## 2021-10-05 LAB — URINALYSIS, ROUTINE W REFLEX MICROSCOPIC
Bilirubin Urine: NEGATIVE
Glucose, UA: >=500 mg/dL — AB
Hgb urine dipstick: NEGATIVE
Ketones, ur: NEGATIVE mg/dL
Nitrite: NEGATIVE
Protein, ur: NEGATIVE mg/dL
Specific Gravity, Urine: 1.016 (ref 1.005–1.030)
pH: 7 (ref 5.0–8.0)

## 2021-10-05 LAB — CBC WITH DIFFERENTIAL/PLATELET
Abs Immature Granulocytes: 0.03 10*3/uL (ref 0.00–0.07)
Basophils Absolute: 0 10*3/uL (ref 0.0–0.1)
Basophils Relative: 0 %
Eosinophils Absolute: 0.1 10*3/uL (ref 0.0–0.5)
Eosinophils Relative: 1 %
HCT: 43.4 % (ref 39.0–52.0)
Hemoglobin: 15.3 g/dL (ref 13.0–17.0)
Immature Granulocytes: 0 %
Lymphocytes Relative: 32 %
Lymphs Abs: 2.9 10*3/uL (ref 0.7–4.0)
MCH: 30.4 pg (ref 26.0–34.0)
MCHC: 35.3 g/dL (ref 30.0–36.0)
MCV: 86.1 fL (ref 80.0–100.0)
Monocytes Absolute: 0.6 10*3/uL (ref 0.1–1.0)
Monocytes Relative: 7 %
Neutro Abs: 5.5 10*3/uL (ref 1.7–7.7)
Neutrophils Relative %: 60 %
Platelets: 254 10*3/uL (ref 150–400)
RBC: 5.04 MIL/uL (ref 4.22–5.81)
RDW: 11.1 % — ABNORMAL LOW (ref 11.5–15.5)
WBC: 9.2 10*3/uL (ref 4.0–10.5)
nRBC: 0 % (ref 0.0–0.2)

## 2021-10-05 LAB — COMPREHENSIVE METABOLIC PANEL
ALT: 49 U/L — ABNORMAL HIGH (ref 0–44)
AST: 19 U/L (ref 15–41)
Albumin: 4.2 g/dL (ref 3.5–5.0)
Alkaline Phosphatase: 60 U/L (ref 38–126)
Anion gap: 8 (ref 5–15)
BUN: 9 mg/dL (ref 6–20)
CO2: 26 mmol/L (ref 22–32)
Calcium: 9.2 mg/dL (ref 8.9–10.3)
Chloride: 100 mmol/L (ref 98–111)
Creatinine, Ser: 0.61 mg/dL (ref 0.61–1.24)
GFR, Estimated: >60 mL/min (ref >60)
Glucose, Bld: 177 mg/dL — ABNORMAL HIGH (ref 70–99)
Potassium: 4 mmol/L (ref 3.5–5.1)
Sodium: 134 mmol/L — ABNORMAL LOW (ref 135–145)
Total Bilirubin: 1.6 mg/dL — ABNORMAL HIGH (ref 0.3–1.2)
Total Protein: 6.9 g/dL (ref 6.5–8.1)

## 2021-10-05 LAB — LIPASE, BLOOD: Lipase: 22 U/L (ref 11–51)

## 2021-10-05 LAB — CBG MONITORING, ED: Glucose-Capillary: 179 mg/dL — ABNORMAL HIGH (ref 70–99)

## 2021-10-05 MED ORDER — ALUM & MAG HYDROXIDE-SIMETH 200-200-20 MG/5ML PO SUSP
30.0000 mL | Freq: Once | ORAL | Status: AC
  Administered 2021-10-05: 30 mL via ORAL
  Filled 2021-10-05: qty 30

## 2021-10-05 MED ORDER — FAMOTIDINE 20 MG PO TABS: 20.0000 mg | ORAL_TABLET | Freq: Two times a day (BID) | ORAL | 0 refills | Status: AC

## 2021-10-05 MED ORDER — SODIUM CHLORIDE 0.9 % IV BOLUS
1000.0000 mL | Freq: Once | INTRAVENOUS | Status: AC
  Administered 2021-10-05: 1000 mL via INTRAVENOUS

## 2021-10-05 MED ORDER — LIDOCAINE VISCOUS HCL 2 % MT SOLN
15.0000 mL | Freq: Once | OROMUCOSAL | Status: AC
  Administered 2021-10-05: 15 mL via ORAL
  Filled 2021-10-05: qty 15

## 2021-10-05 MED ORDER — ONDANSETRON HCL 4 MG PO TABS
4.0000 mg | ORAL_TABLET | Freq: Four times a day (QID) | ORAL | 0 refills | Status: AC
Start: 2021-10-05 — End: ?

## 2021-10-05 MED ORDER — ONDANSETRON HCL 4 MG/2ML IJ SOLN
4.0000 mg | Freq: Once | INTRAMUSCULAR | Status: AC
  Administered 2021-10-05: 4 mg via INTRAVENOUS
  Filled 2021-10-05: qty 2

## 2021-10-05 MED ORDER — IOHEXOL 300 MG/ML  SOLN
80.0000 mL | Freq: Once | INTRAMUSCULAR | Status: AC | PRN
  Administered 2021-10-05: 80 mL via INTRAVENOUS

## 2021-10-05 NOTE — ED Provider Triage Note (Signed)
Emergency Medicine Provider Triage Evaluation Note ? ?Gregory Campos , a 36 y.o. male  was evaluated in triage.  Pt complains of remittent abdominal pain for the past 2 or 3 months.  Patient reports that he vomits every morning.  He also reports intermittent diarrhea.  He states that he does not have an appetite.  He states that he saw his PCP a couple of days ago who is trying to get him a referral for a GI doctor.  He states that he was started on omeprazole without relief of his symptoms.  He states he has lost 20 pounds in the last 3 months. ? ?Review of Systems  ?Positive: + abd pain, nausea, vomiting, diarrhea  ?Negative: - fevers ? ?Physical Exam  ?BP 102/87   Pulse (!) 101   Temp 99 ?F (37.2 ?C)   Resp 16   Ht 5\' 8"  (1.727 m)   Wt 60.3 kg   SpO2 98%   BMI 20.22 kg/m?  ?Gen:   Awake, no distress   ?Resp:  Normal effort  ?MSK:   Moves extremities without difficulty  ?Other:  Abd soft and nontender ? ?Medical Decision Making  ?Medically screening exam initiated at 6:05 PM.  Appropriate orders placed.  [name] was informed that the remainder of the evaluation will be completed by another provider, this initial triage assessment does not replace that evaluation, and the importance of remaining in the ED until their evaluation is complete. ? ? ?  ?Celso Amy, PA-C ?10/05/21 1806 ? ?

## 2021-10-05 NOTE — Discharge Instructions (Signed)
Take zofran and pepcid as prescribed  ? ?Please follow up with your primary doctor within the next 5-7 days.  If you do not have a primary care provider, information for a healthcare clinic has been provided for you to make arrangements for follow up care. Please return to the ER sooner if you have any new or worsening symptoms, or if you have any of the following symptoms: ? ?Abdominal pain that does not go away.  ?You have a fever.  ?You keep throwing up (vomiting).  ?The pain is felt only in portions of the abdomen. Pain in the right side could possibly be appendicitis. In an adult, pain in the left lower portion of the abdomen could be colitis or diverticulitis.  ?You pass bloody or black tarry stools.  ?There is bright red blood in the stool.  ?The constipation stays for more than 4 days.  ?There is belly (abdominal) or rectal pain.  ?You do not seem to be getting better.  ?You have any questions or concerns.  ? ?

## 2021-10-05 NOTE — ED Provider Notes (Signed)
?[facility] ?Provider Note ? ? ?CSN: [id number] ?Arrival date & time: 10/05/21  1714 ? ?  ? ?History ? ?Chief Complaint  ?Patient presents with  ? Abdominal Pain  ? ? ?Gregory Campos is a 36 y.o. male. ? ?HPI ? ?36 year old male with a history of dyslipidemia, diabetes, intersphincteric abscess, who presents to the emergency department today for evaluation of abdominal discomfort.  He states for the last several months he has had intermittent abdominal pain that is worse with eating.  He also reports associated nausea vomiting and diarrhea as well.  Denies hematemesis, hematochezia.  He is does not think that he has had any fevers or chills.  He denies use of marijuana but does use CBD.  He states that he used to drink alcohol but stopped a few months ago.  He was seen by his PCP about his symptoms and was referred to GI but has not been able to follow-up yet.  States has been vomiting all morning which prompted his ED visit today. ? ?States he has lost about 20-30 lbs recently ? ?Home Medications ?Prior to Admission medications   ?Medication Sig Start Date End Date Taking? Authorizing Provider  ?famotidine (PEPCID) 20 MG tablet Take 1 tablet (20 mg total) by mouth 2 (two) times daily. 10/05/21  Yes Symiah Nowotny S, PA-C  ?ondansetron (ZOFRAN) 4 MG tablet Take 1 tablet (4 mg total) by mouth every 6 (six) hours. 10/05/21  Yes Alyanna Stoermer S, PA-C  ?acetaminophen (TYLENOL) 500 MG tablet Take 2 tablets (1,000 mg total) by mouth every 6 (six) hours as needed for mild pain. 06/02/21   Maczis, Barth Kirks, PA-C  ?amoxicillin-clavulanate (AUGMENTIN) 875-125 MG tablet Take 1 tablet by mouth every 12 (twelve) hours. 06/02/21   Maczis, Barth Kirks, PA-C  ?blood glucose meter kit and supplies Dispense based on patient and insurance preference. Use up to four times daily as directed. (FOR ICD-10 E10.9, E11.9). 06/02/21   Maczis, Barth Kirks, PA-C  ?cyanocobalamin (,VITAMIN B-12,) 1000  MCG/ML injection Inject 1,000 mcg into the muscle every 30 (thirty) days. 05/12/21   [provider]  ?insulin glargine (LANTUS SOLOSTAR) 100 UNIT/ML Solostar Pen Inject 14 Units into the skin daily. 06/02/21   Maczis, Barth Kirks, PA-C  ?Insulin Pen Needle 32G X 4 MM MISC Use 1 box as needed 06/02/21   Maczis, Barth Kirks, PA-C  ?metFORMIN (GLUCOPHAGE) 1000 MG tablet Take 1,000 mg by mouth 2 (two) times daily. 05/12/21   [provider]  ?oxyCODONE (OXY IR/ROXICODONE) 5 MG immediate release tablet Take 1 tablet (5 mg total) by mouth every 6 (six) hours as needed for severe pain. 06/02/21   Maczis, Barth Kirks, PA-C  ?Polyethylene Glycol 3350 (PEG 3350) 17 GM/SCOOP POWD Take 17 g by mouth in the morning and at bedtime. 05/29/21   [provider]  ?ramipril (ALTACE) 5 MG capsule Take 5 mg by mouth daily. 05/12/21   [provider]  ?rosuvastatin (CRESTOR) 5 MG tablet Take 5 mg by mouth daily. 05/12/21   [provider]  ?tamsulosin (FLOMAX) 0.4 MG CAPS capsule Take 1 capsule (0.4 mg total) by mouth daily. 06/03/21   Maczis, Barth Kirks, PA-C  ?   ? ?Allergies    ?Patient has no known allergies.   ? ?Review of Systems   ?Review of Systems ?See HPI for pertinent positives or negatives. ? ? ?Physical Exam ?Updated Vital Signs ?BP 118/84   Pulse 78   Temp 99 ?F (37.2 ?C)  Resp 16   Ht 5' 8"  (1.727 m)   Wt 60.3 kg   SpO2 98%   BMI 20.22 kg/m?  ?Physical Exam ?Vitals and nursing note reviewed.  ?Constitutional:   ?   General: He is not in acute distress. ?   Appearance: He is well-developed.  ?HENT:  ?   Head: Normocephalic and atraumatic.  ?   Mouth/Throat:  ?   Mouth: Mucous membranes are dry.  ?Eyes:  ?   Conjunctiva/sclera: Conjunctivae normal.  ?Cardiovascular:  ?   Rate and Rhythm: Normal rate and regular rhythm.  ?   Heart sounds: No murmur heard. ?Pulmonary:  ?   Effort: Pulmonary effort is normal. No respiratory distress.  ?   Breath sounds: Normal breath sounds.   ?Abdominal:  ?   General: Bowel sounds are normal.  ?   Palpations: Abdomen is soft.  ?   Tenderness: There is no abdominal tenderness. There is no guarding or rebound.  ?Musculoskeletal:     ?   General: No swelling.  ?   Cervical back: Neck supple.  ?Skin: ?   General: Skin is warm and dry.  ?   Capillary Refill: Capillary refill takes less than 2 seconds.  ?Neurological:  ?   Mental Status: He is alert.  ?Psychiatric:     ?   Mood and Affect: Mood normal.  ? ? ? ?ED Results / Procedures / Treatments   ?Labs ?(all labs ordered are listed, but only abnormal results are displayed) ?Labs Reviewed  ?CBC WITH DIFFERENTIAL/PLATELET - Abnormal; Notable for the following components:  ?    Result Value  ? RDW 11.1 (*)   ? All other components within normal limits  ?COMPREHENSIVE METABOLIC PANEL - Abnormal; Notable for the following components:  ? Sodium 134 (*)   ? Glucose, Bld 177 (*)   ? ALT 49 (*)   ? Total Bilirubin 1.6 (*)   ? All other components within normal limits  ?URINALYSIS, ROUTINE W REFLEX MICROSCOPIC - Abnormal; Notable for the following components:  ? Glucose, UA >=500 (*)   ? Leukocytes,Ua TRACE (*)   ? Bacteria, UA RARE (*)   ? All other components within normal limits  ?CBG MONITORING, ED - Abnormal; Notable for the following components:  ? Glucose-Capillary 179 (*)   ? All other components within normal limits  ?LIPASE, BLOOD  ? ? ?EKG ?None ? ?Radiology ?CT ABDOMEN PELVIS W CONTRAST ? ?Result Date: 10/05/2021 ?CLINICAL DATA:  Epigastric abdominal pain, vomiting, unintentional weight loss EXAM: CT ABDOMEN AND PELVIS WITH CONTRAST TECHNIQUE: Multidetector CT imaging of the abdomen and pelvis was performed using the standard protocol following bolus administration of intravenous contrast. RADIATION DOSE REDUCTION: This exam was performed according to the departmental dose-optimization program which includes automated exposure control, adjustment of the mA and/or kV according to patient size and/or use of  iterative reconstruction technique. CONTRAST:  36m OMNIPAQUE IOHEXOL 300 MG/ML  SOLN COMPARISON:  05/31/2021 FINDINGS: Lower chest: No acute abnormality. Hepatobiliary: 13 mm hemangioma noted within the right hepatic lobe. No additional intrahepatic enhancing mass. There is no intra or extrahepatic biliary ductal dilation. Gallbladder unremarkable. Pancreas: Unremarkable Spleen: Unremarkable Adrenals/Urinary Tract: Adrenal glands are unremarkable. Kidneys are normal, without renal calculi, focal lesion, or hydronephrosis. Bladder is unremarkable. Stomach/Bowel: Stomach is within normal limits. Appendix appears normal. No evidence of bowel wall thickening, distention, or inflammatory changes. Vascular/Lymphatic: No significant vascular findings are present. No enlarged abdominal or pelvic lymph nodes. Reproductive: Prostate is unremarkable.  Other: No abdominal wall hernia.  Rectum unremarkable. Musculoskeletal: No acute bone abnormality. No lytic or blastic bone lesion. IMPRESSION: No acute intra-abdominal pathology identified. No definite radiographic explanation for the patient's reported symptoms. Electronically Signed   By: Fidela Salisbury M.D.   On: 10/05/2021 23:28   ? ?Procedures ?Procedures  ? ? ?Medications Ordered in ED ?Medications  ?sodium chloride 0.9 % bolus 1,000 mL (1,000 mLs Intravenous New Bag/Given 10/05/21 2143)  ?ondansetron [facility]) injection 4 mg (4 mg Intravenous Given 10/05/21 2143)  ?alum & mag hydroxide-simeth (MAALOX/MYLANTA) 200-200-20 MG/5ML suspension 30 mL (30 mLs Oral Given 10/05/21 2143)  ?  And  ?lidocaine (XYLOCAINE) 2 % viscous mouth solution 15 mL (15 mLs Oral Given 10/05/21 2143)  ?iohexol (OMNIPAQUE) 300 MG/ML solution 80 mL (80 mLs Intravenous Contrast Given 10/05/21 2319)  ? ? ?ED Course/ Medical Decision Making/ A&P ?  ?                        ?Medical Decision Making ?Amount and/or Complexity of Data Reviewed ?Radiology: ordered. ? ?Risk ?OTC drugs. ?Prescription drug  management. ? ? ?This patient presents to the ED for concern of abd pain, nv, this involves an extensive number of treatment options, and is a complaint that carries with it a high risk of complications and morbidity.  The dif

## 2021-10-05 NOTE — ED Triage Notes (Signed)
Patient c/o abdominal pain over the past few months. States first thing in the morning he tries to eat and throws up. States he has lost over 20 lbs in the last couple months. Pt states he is diabetic but does not check his blood sugars at home.  ?

## 2021-10-31 ENCOUNTER — Encounter: Payer: Self-pay | Admitting: Nurse Practitioner

## 2021-11-24 DIAGNOSIS — R64 Cachexia: Secondary | ICD-10-CM | POA: Insufficient documentation

## 2021-11-24 DIAGNOSIS — G4700 Insomnia, unspecified: Secondary | ICD-10-CM | POA: Insufficient documentation

## 2022-01-03 ENCOUNTER — Encounter: Payer: Self-pay | Admitting: Physician Assistant

## 2022-01-08 DIAGNOSIS — Z794 Long term (current) use of insulin: Secondary | ICD-10-CM | POA: Insufficient documentation

## 2022-01-08 DIAGNOSIS — R5383 Other fatigue: Secondary | ICD-10-CM | POA: Insufficient documentation

## 2022-01-09 ENCOUNTER — Encounter (HOSPITAL_COMMUNITY): Payer: Self-pay

## 2022-01-09 ENCOUNTER — Other Ambulatory Visit: Payer: Self-pay

## 2022-01-09 ENCOUNTER — Emergency Department (HOSPITAL_COMMUNITY)
Admission: EM | Admit: 2022-01-09 | Discharge: 2022-01-09 | Disposition: A | Discharge status: Home / Self Care | Payer: Medicaid Other | Attending: Emergency Medicine | Admitting: Emergency Medicine

## 2022-01-09 DIAGNOSIS — R5383 Other fatigue: Secondary | ICD-10-CM

## 2022-01-09 LAB — I-STAT CHEM 8, ED
BUN: 7 mg/dL (ref 6–20)
Calcium, Ion: 1.17 mmol/L (ref 1.15–1.40)
Chloride: 98 mmol/L (ref 98–111)
Creatinine, Ser: 0.5 mg/dL — ABNORMAL LOW (ref 0.61–1.24)
Glucose, Bld: 215 mg/dL — ABNORMAL HIGH (ref 70–99)
HCT: 33 % — ABNORMAL LOW (ref 39.0–52.0)
Hemoglobin: 11.2 g/dL — ABNORMAL LOW (ref 13.0–17.0)
Potassium: 3.9 mmol/L (ref 3.5–5.1)
Sodium: 132 mmol/L — ABNORMAL LOW (ref 135–145)
TCO2: 19 mmol/L — ABNORMAL LOW (ref 22–32)

## 2022-01-09 LAB — CBC
HCT: 32.4 % — ABNORMAL LOW (ref 39.0–52.0)
Hemoglobin: 11.5 g/dL — ABNORMAL LOW (ref 13.0–17.0)
MCH: 29.9 pg (ref 26.0–34.0)
MCHC: 35.5 g/dL (ref 30.0–36.0)
MCV: 84.4 fL (ref 80.0–100.0)
Platelets: 287 10*3/uL (ref 150–400)
RBC: 3.84 MIL/uL — ABNORMAL LOW (ref 4.22–5.81)
RDW: 12.1 % (ref 11.5–15.5)
WBC: 7.3 10*3/uL (ref 4.0–10.5)
nRBC: 0 % (ref 0.0–0.2)

## 2022-01-09 MED ORDER — ALUM & MAG HYDROXIDE-SIMETH 200-200-20 MG/5ML PO SUSP
30.0000 mL | Freq: Once | ORAL | Status: AC
Start: 2022-01-09 — End: 2022-01-09
  Administered 2022-01-09: 30 mL via ORAL
  Filled 2022-01-09: qty 30

## 2022-01-09 MED ORDER — LIDOCAINE VISCOUS HCL 2 % MT SOLN
15.0000 mL | Freq: Once | OROMUCOSAL | Status: AC
  Administered 2022-01-09: 15 mL via ORAL
  Filled 2022-01-09: qty 15

## 2022-01-09 NOTE — ED Provider Notes (Addendum)
[facility] DEPT Provider Note   CSN: [id number] Arrival date & time: 01/08/22  2356     History  Chief Complaint  Patient presents with   Fatigue    Gregory Campos is a 36 y.o. male.   Illness Location:  Body Quality:  Fatigue Severity:  Moderate Onset quality:  Gradual Duration:  24 months Timing:  Constant Progression:  Unchanged Chronicity:  Chronic Context:  Has had extensive work up without issue Relieved by:  Nothing Worsened by:  Nothing Ineffective treatments:  None Associated symptoms: no diarrhea, no fever, no nausea and no wheezing   Risk factors:  None No CP and SOB.  Patient with fatigue for 2 years since his wife left him. Has been seen multiple times for this.  Was in prison for 13 months and symptoms worsened there. States he has had multiple medications adjusted by PMD and multiple specialists without relief.  No focal weakness.  No changes in vision or speech.  No f/c/r.  He complains of ongoing flatulence.       Home Medications Prior to Admission medications   Medication Sig Start Date End Date Taking? Authorizing Provider  acetaminophen (TYLENOL) 500 MG tablet Take 2 tablets (1,000 mg total) by mouth every 6 (six) hours as needed for mild pain. 06/02/21   Maczis, Barth Kirks, PA-C  amoxicillin-clavulanate (AUGMENTIN) 875-125 MG tablet Take 1 tablet by mouth every 12 (twelve) hours. 06/02/21   Maczis, Barth Kirks, PA-C  blood glucose meter kit and supplies Dispense based on patient and insurance preference. Use up to four times daily as directed. (FOR ICD-10 E10.9, E11.9). 06/02/21   Maczis, Barth Kirks, PA-C  cyanocobalamin (,VITAMIN B-12,) 1000 MCG/ML injection Inject 1,000 mcg into the muscle every 30 (thirty) days. 05/12/21   [provider]  famotidine (PEPCID) 20 MG tablet Take 1 tablet (20 mg total) by mouth 2 (two) times daily. 10/05/21   Couture, Cortni S, PA-C  insulin glargine (LANTUS SOLOSTAR) 100  UNIT/ML Solostar Pen Inject 14 Units into the skin daily. 06/02/21   Maczis, Barth Kirks, PA-C  Insulin Pen Needle 32G X 4 MM MISC Use 1 box as needed 06/02/21   Maczis, Barth Kirks, PA-C  metFORMIN (GLUCOPHAGE) 1000 MG tablet Take 1,000 mg by mouth 2 (two) times daily. 05/12/21   [provider]  ondansetron (ZOFRAN) 4 MG tablet Take 1 tablet (4 mg total) by mouth every 6 (six) hours. 10/05/21   Couture, Cortni S, PA-C  oxyCODONE (OXY IR/ROXICODONE) 5 MG immediate release tablet Take 1 tablet (5 mg total) by mouth every 6 (six) hours as needed for severe pain. 06/02/21   Maczis, Barth Kirks, PA-C  Polyethylene Glycol 3350 (PEG 3350) 17 GM/SCOOP POWD Take 17 g by mouth in the morning and at bedtime. 05/29/21   [provider]  ramipril (ALTACE) 5 MG capsule Take 5 mg by mouth daily. 05/12/21   [provider]  rosuvastatin (CRESTOR) 5 MG tablet Take 5 mg by mouth daily. 05/12/21   [provider]  tamsulosin (FLOMAX) 0.4 MG CAPS capsule Take 1 capsule (0.4 mg total) by mouth daily. 06/03/21   Maczis, Barth Kirks, PA-C      Allergies    Patient has no known allergies.    Review of Systems   Review of Systems  Constitutional:  Negative for fever.  HENT:  Negative for facial swelling.   Eyes:  Negative for redness.  Respiratory:  Negative for wheezing.   Gastrointestinal:  Negative for  diarrhea and nausea.  Neurological:  Negative for facial asymmetry.  All other systems reviewed and are negative.   Physical Exam Updated Vital Signs BP 106/81   Pulse 97   Resp 17   SpO2 100%  Physical Exam Vitals and nursing note reviewed.  Constitutional:      General: He is not in acute distress.    Appearance: He is well-developed. He is not diaphoretic.  HENT:     Head: Normocephalic and atraumatic.     Nose: Nose normal.  Eyes:     Extraocular Movements: Extraocular movements intact.     Conjunctiva/sclera: Conjunctivae normal.     Pupils: Pupils are equal,  round, and reactive to light.  Cardiovascular:     Rate and Rhythm: Normal rate and regular rhythm.     Pulses: Normal pulses.     Heart sounds: Normal heart sounds.  Pulmonary:     Effort: Pulmonary effort is normal.     Breath sounds: Normal breath sounds. No wheezing or rales.  Abdominal:     General: Abdomen is flat. Bowel sounds are normal.     Palpations: Abdomen is soft.     Tenderness: There is no abdominal tenderness. There is no guarding or rebound.  Musculoskeletal:        General: Normal range of motion.     Cervical back: Normal range of motion and neck supple.  Skin:    General: Skin is warm and dry.     Capillary Refill: Capillary refill takes less than 2 seconds.  Neurological:     General: No focal deficit present.     Mental Status: He is alert and oriented to person, place, and time.     Motor: No weakness.     Deep Tendon Reflexes: Reflexes normal.     Comments: Moving all 4 extremities 5/5   Psychiatric:        Thought Content: Thought content normal.     Comments: No SI or HI     ED Results / Procedures / Treatments   Labs (all labs ordered are listed, but only abnormal results are displayed) Results for orders placed or performed during the hospital encounter of 01/09/22  CBC  Result Value Ref Range   WBC 7.3 4.0 - 10.5 K/uL   RBC 3.84 (L) 4.22 - 5.81 MIL/uL   Hemoglobin 11.5 (L) 13.0 - 17.0 g/dL   HCT 32.4 (L) 39.0 - 52.0 %   MCV 84.4 80.0 - 100.0 fL   MCH 29.9 26.0 - 34.0 pg   MCHC 35.5 30.0 - 36.0 g/dL   RDW 12.1 11.5 - 15.5 %   Platelets 287 150 - 400 K/uL   nRBC 0.0 0.0 - 0.2 %  I-stat chem 8, ED (not at [facility] or [facility])  Result Value Ref Range   Sodium 132 (L) 135 - 145 mmol/L   Potassium 3.9 3.5 - 5.1 mmol/L   Chloride 98 98 - 111 mmol/L   BUN 7 6 - 20 mg/dL   Creatinine, Ser 0.50 (L) 0.61 - 1.24 mg/dL   Glucose, Bld 215 (H) 70 - 99 mg/dL   Calcium, Ion 1.17 1.15 - 1.40 mmol/L   TCO2 19 (L) 22 - 32 mmol/L   Hemoglobin 11.2 (L) 13.0 -  17.0 g/dL   HCT 33.0 (L) 39.0 - 52.0 %   No results found.   Radiology No results found.  Procedures Procedures    Medications Ordered in ED Medications  alum & mag hydroxide-simeth (MAALOX/MYLANTA)  200-200-20 MG/5ML suspension 30 mL (30 mLs Oral Given 01/09/22 0053)    And  lidocaine (XYLOCAINE) 2 % viscous mouth solution 15 mL (15 mLs Oral Given 01/09/22 0053)    ED Course/ Medical Decision Making/ A&P                           Medical Decision Making Chronic fatigue   Amount and/or Complexity of Data Reviewed External Data Reviewed: notes.    Details: previous notes reviewed Labs: ordered.    Details: all labs reviewed:  normal white count 7.3, hemoglobin 11.5 low, platelets 287k.  Normal potassium 3.9, Normal BUN 7, creatinine normal .5  Risk OTC drugs. Prescription drug management. Risk Details: I spoke to the patient at length during the history and stated that it is unlikely that we would find a cause of his ongoing illness for 2 years, especially in light of the fact that his PMD and specialist have been unable to help him despite ongoing evaluations and multiple medications.  Moreover, patient was not able to tell me that any of his symptoms were new or changed.  I explained at that time I would to basic labs and provide a GI cocktail since he was concerned about flatulence.  Patient became aggressive at discharge and refused to leave.  EDP stated that we were not able to find anything on labs and his exam and vitals were benign and reassuring.  I politely stated we were not saying anything was wrong but that an ongoing illness, as I previously stated, would need to be followed by his family doctor.  Patient complained nurse was rude to him and EDP apologized that he felt anyone was rude but that as I had said earlier with a normal exam and vitals and labs that the patient would be discharged to home.  Patient (with nursing and security and [employer] present) was instructed to  follow up with his family doctor and or specialist that he is currently under the care of for his fatigue and diabetes.   I explained chronic fatigue is something the family doctor is currently evaluating and this should continue. I explained at length that there is nothing to admit the patient with. He is stable and resting comfortably in the room. I had explained when I saw him that I would not be adjusting chronic medications, this would need to be done by his PMD or specialist as they know him best and given labs I have no reason to make any adjustments. He is yelling and now coming up with new reasons why he needs to be here that were not voiced on history and physical.  Exam and vitals are benign and reassuring as are labs.  There are no acute Emergency medical conditions at this time.  Patient has been reasonably evaluated with exam and labs.  Patient is stable for discharge with close follow up.  Ambulatory referral to neurology for global weakness made.      Final Clinical Impression(s) / ED Diagnoses Final diagnoses:  Other fatigue   Return for intractable cough, coughing up blood, fevers > 100.4 unrelieved by medication, shortness of breath, intractable vomiting, chest pain, shortness of breath, weakness, numbness, changes in speech, facial asymmetry, abdominal pain, passing out, Inability to tolerate liquids or food, cough, altered mental status or any concerns. No signs of systemic illness or infection. The patient is nontoxic-appearing on exam and vital signs are within normal limits.  I  have reviewed the triage vital signs and the nursing notes. Pertinent labs & imaging results that were available during my care of the patient were reviewed by me and considered in my medical decision making (see chart for details). After history, exam, and medical workup I feel the patient has been appropriately medically screened and is safe for discharge home. Pertinent diagnoses were discussed with the  patient. Patient was given return precautions.      Loreda Silverio, MD 01/10/22 [phone]

## 2022-01-11 ENCOUNTER — Other Ambulatory Visit: Payer: Self-pay

## 2022-01-11 ENCOUNTER — Encounter (HOSPITAL_COMMUNITY): Payer: Self-pay | Admitting: Emergency Medicine

## 2022-01-11 ENCOUNTER — Emergency Department (HOSPITAL_COMMUNITY)
Admission: EM | Admit: 2022-01-11 | Discharge: 2022-01-11 | Disposition: A | Discharge status: Home / Self Care | Payer: Medicaid Other | Attending: Emergency Medicine | Admitting: Emergency Medicine

## 2022-01-11 ENCOUNTER — Emergency Department (HOSPITAL_COMMUNITY): Payer: Medicaid Other

## 2022-01-11 DIAGNOSIS — R1032 Left lower quadrant pain: Secondary | ICD-10-CM | POA: Insufficient documentation

## 2022-01-11 DIAGNOSIS — R1031 Right lower quadrant pain: Secondary | ICD-10-CM | POA: Diagnosis not present

## 2022-01-11 DIAGNOSIS — R112 Nausea with vomiting, unspecified: Secondary | ICD-10-CM | POA: Diagnosis present

## 2022-01-11 DIAGNOSIS — R197 Diarrhea, unspecified: Secondary | ICD-10-CM | POA: Insufficient documentation

## 2022-01-11 DIAGNOSIS — R1030 Lower abdominal pain, unspecified: Secondary | ICD-10-CM

## 2022-01-11 DIAGNOSIS — R1084 Generalized abdominal pain: Secondary | ICD-10-CM | POA: Insufficient documentation

## 2022-01-11 LAB — CBC
HCT: 33.1 % — ABNORMAL LOW (ref 39.0–52.0)
Hemoglobin: 11.8 g/dL — ABNORMAL LOW (ref 13.0–17.0)
MCH: 29.9 pg (ref 26.0–34.0)
MCHC: 35.6 g/dL (ref 30.0–36.0)
MCV: 83.8 fL (ref 80.0–100.0)
Platelets: 307 10*3/uL (ref 150–400)
RBC: 3.95 MIL/uL — ABNORMAL LOW (ref 4.22–5.81)
RDW: 12.1 % (ref 11.5–15.5)
WBC: 7.7 10*3/uL (ref 4.0–10.5)
nRBC: 0 % (ref 0.0–0.2)

## 2022-01-11 LAB — COMPREHENSIVE METABOLIC PANEL
ALT: 52 U/L — ABNORMAL HIGH (ref 0–44)
AST: 31 U/L (ref 15–41)
Albumin: 3.6 g/dL (ref 3.5–5.0)
Alkaline Phosphatase: 51 U/L (ref 38–126)
Anion gap: 12 (ref 5–15)
BUN: <5 mg/dL — ABNORMAL LOW (ref 6–20)
CO2: 21 mmol/L — ABNORMAL LOW (ref 22–32)
Calcium: 8.9 mg/dL (ref 8.9–10.3)
Chloride: 97 mmol/L — ABNORMAL LOW (ref 98–111)
Creatinine, Ser: 0.76 mg/dL (ref 0.61–1.24)
GFR, Estimated: >60 mL/min (ref >60)
Glucose, Bld: 253 mg/dL — ABNORMAL HIGH (ref 70–99)
Potassium: 3.9 mmol/L (ref 3.5–5.1)
Sodium: 130 mmol/L — ABNORMAL LOW (ref 135–145)
Total Bilirubin: 1 mg/dL (ref 0.3–1.2)
Total Protein: 6.8 g/dL (ref 6.5–8.1)

## 2022-01-11 LAB — LIPASE, BLOOD: Lipase: 25 U/L (ref 11–51)

## 2022-01-11 MED ORDER — ONDANSETRON 4 MG PO TBDP: 4.0000 mg | ORAL_TABLET | Freq: Three times a day (TID) | ORAL | 0 refills | Status: AC | PRN

## 2022-01-11 MED ORDER — LOPERAMIDE HCL 2 MG PO CAPS: 2.0000 mg | ORAL_CAPSULE | Freq: Four times a day (QID) | ORAL | 0 refills | Status: AC | PRN

## 2022-01-11 MED ORDER — ONDANSETRON HCL 4 MG/2ML IJ SOLN
4.0000 mg | Freq: Once | INTRAMUSCULAR | Status: AC
  Administered 2022-01-11: 4 mg via INTRAVENOUS
  Filled 2022-01-11: qty 2

## 2022-01-11 MED ORDER — SODIUM CHLORIDE 0.9 % IV BOLUS
1000.0000 mL | Freq: Once | INTRAVENOUS | Status: AC
  Administered 2022-01-11: 1000 mL via INTRAVENOUS

## 2022-01-11 MED ORDER — METOCLOPRAMIDE HCL 5 MG/ML IJ SOLN
10.0000 mg | Freq: Once | INTRAMUSCULAR | Status: AC
  Administered 2022-01-11: 10 mg via INTRAVENOUS
  Filled 2022-01-11: qty 2

## 2022-01-11 MED ORDER — IOHEXOL 300 MG/ML  SOLN
100.0000 mL | Freq: Once | INTRAMUSCULAR | Status: AC | PRN
  Administered 2022-01-11: 100 mL via INTRAVENOUS

## 2022-01-11 MED ORDER — DICYCLOMINE HCL 20 MG PO TABS: 20.0000 mg | ORAL_TABLET | Freq: Three times a day (TID) | ORAL | 0 refills | Status: AC | PRN

## 2022-01-11 MED ORDER — DIPHENHYDRAMINE HCL 50 MG/ML IJ SOLN
25.0000 mg | Freq: Once | INTRAMUSCULAR | Status: AC
  Administered 2022-01-11: 25 mg via INTRAVENOUS
  Filled 2022-01-11: qty 1

## 2022-01-11 NOTE — Discharge Instructions (Signed)

## 2022-01-11 NOTE — ED Triage Notes (Signed)
Patient reports pain across his abdomen with emesis and diarrhea this week .

## 2022-01-11 NOTE — ED Provider Notes (Signed)
[facility] EMERGENCY DEPARTMENT Provider Note   CSN: [id number] Arrival date & time: 01/11/22  0540     History  Chief Complaint  Patient presents with   Emesis Gregory Campos    Gregory Campos is a 36 y.o. male.  Patient presents to the emergency department for evaluation of diffuse abdominal pain with nausea, vomiting and diarrhea.  Symptoms ongoing for several days.  Patient accompanied by friend/family member who translates for him.       Home Medications Prior to Admission medications   Medication Sig Start Date End Date Taking? Authorizing Provider  acetaminophen (TYLENOL) 500 MG tablet Take 2 tablets (1,000 mg total) by mouth every 6 (six) hours as needed for mild pain. 06/02/21   Maczis, Barth Kirks, PA-C  amoxicillin-clavulanate (AUGMENTIN) 875-125 MG tablet Take 1 tablet by mouth every 12 (twelve) hours. 06/02/21   Maczis, Barth Kirks, PA-C  blood glucose meter kit and supplies Dispense based on patient and insurance preference. Use up to four times daily as directed. (FOR ICD-10 E10.9, E11.9). 06/02/21   Maczis, Barth Kirks, PA-C  cyanocobalamin (,VITAMIN B-12,) 1000 MCG/ML injection Inject 1,000 mcg into the muscle every 30 (thirty) days. 05/12/21   [provider]  famotidine (PEPCID) 20 MG tablet Take 1 tablet (20 mg total) by mouth 2 (two) times daily. 10/05/21   Couture, Cortni S, PA-C  insulin glargine (LANTUS SOLOSTAR) 100 UNIT/ML Solostar Pen Inject 14 Units into the skin daily. 06/02/21   Maczis, Barth Kirks, PA-C  Insulin Pen Needle 32G X 4 MM MISC Use 1 box as needed 06/02/21   Maczis, Barth Kirks, PA-C  metFORMIN (GLUCOPHAGE) 1000 MG tablet Take 1,000 mg by mouth 2 (two) times daily. 05/12/21   [provider]  ondansetron (ZOFRAN) 4 MG tablet Take 1 tablet (4 mg total) by mouth every 6 (six) hours. 10/05/21   Couture, Cortni S, PA-C  oxyCODONE (OXY IR/ROXICODONE) 5 MG immediate release tablet Take 1 tablet (5 mg total) by mouth every  6 (six) hours as needed for severe pain. 06/02/21   Maczis, Barth Kirks, PA-C  Polyethylene Glycol 3350 (PEG 3350) 17 GM/SCOOP POWD Take 17 g by mouth in the morning and at bedtime. 05/29/21   [provider]  ramipril (ALTACE) 5 MG capsule Take 5 mg by mouth daily. 05/12/21   [provider]  rosuvastatin (CRESTOR) 5 MG tablet Take 5 mg by mouth daily. 05/12/21   [provider]  tamsulosin (FLOMAX) 0.4 MG CAPS capsule Take 1 capsule (0.4 mg total) by mouth daily. 06/03/21   Maczis, Barth Kirks, PA-C      Allergies    Patient has no known allergies.    Review of Systems   Review of Systems  Physical Exam Updated Vital Signs BP 113/85 (BP Location: Right Arm)   Pulse (!) 106   Temp 98 F (36.7 C) (Oral)   Resp 19   SpO2 99%  Physical Exam Vitals and nursing note reviewed.  Constitutional:      General: He is not in acute distress.    Appearance: He is well-developed.  HENT:     Head: Normocephalic and atraumatic.     Mouth/Throat:     Mouth: Mucous membranes are moist.  Eyes:     General: Vision grossly intact. Gaze aligned appropriately.     Extraocular Movements: Extraocular movements intact.     Conjunctiva/sclera: Conjunctivae normal.  Cardiovascular:     Rate and Rhythm: Normal rate and regular rhythm.  Pulses: Normal pulses.     Heart sounds: Normal heart sounds, S1 normal and S2 normal. No murmur heard.    No friction rub. No gallop.  Pulmonary:     Effort: Pulmonary effort is normal. No respiratory distress.     Breath sounds: Normal breath sounds.  Abdominal:     Palpations: Abdomen is soft.     Tenderness: There is abdominal tenderness in the right lower quadrant, suprapubic area and left lower quadrant. There is no guarding or rebound.     Hernia: No hernia is present.  Musculoskeletal:        General: No swelling.     Cervical back: Full passive range of motion without pain, normal range of motion and neck supple. No pain with  movement, spinous process tenderness or muscular tenderness. Normal range of motion.     Right lower leg: No edema.     Left lower leg: No edema.  Skin:    General: Skin is warm and dry.     Capillary Refill: Capillary refill takes less than 2 seconds.     Findings: No ecchymosis, erythema, lesion or wound.  Neurological:     Mental Status: He is alert and oriented to person, place, and time.     GCS: GCS eye subscore is 4. GCS verbal subscore is 5. GCS motor subscore is 6.     Cranial Nerves: Cranial nerves 2-12 are intact.     Sensory: Sensation is intact.     Motor: Motor function is intact. No weakness or abnormal muscle tone.     Coordination: Coordination is intact.  Psychiatric:        Mood and Affect: Mood normal.        Speech: Speech normal.        Behavior: Behavior normal.     ED Results / Procedures / Treatments   Labs (all labs ordered are listed, but only abnormal results are displayed) Labs Reviewed  CBC - Abnormal; Notable for the following components:      Result Value   RBC 3.95 (*)    Hemoglobin 11.8 (*)    HCT 33.1 (*)    All other components within normal limits  LIPASE, BLOOD  COMPREHENSIVE METABOLIC PANEL  URINALYSIS, ROUTINE W REFLEX MICROSCOPIC    EKG None  Radiology No results found.  Procedures Procedures    Medications Ordered in ED Medications  sodium chloride 0.9 % bolus 1,000 mL (has no administration in time range)  ondansetron (ZOFRAN) injection 4 mg (has no administration in time range)  metoCLOPramide (REGLAN) injection 10 mg (has no administration in time range)    ED Course/ Medical Decision Making/ A&P                           Medical Decision Making Amount and/or Complexity of Data Reviewed Labs: ordered. Radiology: ordered.  Risk Prescription drug management.   Patient presents to the emergency department with complaints of diffuse lower abdominal pain associated with nausea, vomiting and diarrhea.  He  indicates that this is a recurrent problem.  Patient appears dehydrated clinically.  Will hydrate.  Patient with persistent emesis, treated with antiemetics.  Lab work and imaging pending. Will sign out to oncoming physician to follow up.        Final Clinical Impression(s) / ED Diagnoses Final diagnoses:  Lower abdominal pain  Nausea vomiting and diarrhea    Rx / DC Orders ED Discharge Orders  None         Orpah Greek, MD 01/11/22 [phone]

## 2022-01-11 NOTE — ED Provider Notes (Signed)
Blood pressure (!) 114/91, pulse (!) 102, temperature 98 F (36.7 C), temperature source Oral, resp. rate 20, SpO2 100 %.  Assuming care from Dr. Blinda Leatherwood.  In short, Gregory Campos is a 36 y.o. male with a chief complaint of Emesis /Diarrhea .  Refer to the original H&P for additional details.  The current plan of care is to follow up on CT imaging and reassess.  09:00 AM  CT imaging independently evaluated and agree with radiology interpretation of diarrheal state without colitis or obstruction.  Lab work shows pseudohyponatremia with hyperglycemia but no diabetic ketoacidosis.  No leukocytosis.  On reassessment the patient states he is feeling better after IV fluids and nausea medication.  I reviewed the patient's medications at bedside and will prescribe Zofran and Imodium to help with symptoms.  Discussed mainly liquid diet and advance as tolerated.  Discussed tricked ED return precautions.    Maia Plan, MD 01/11/22 [phone]

## 2022-01-29 NOTE — Progress Notes (Unsigned)
01/30/2022 Gregory Campos [id number] February 01, 1986  Referring provider: Vonna Drafts, FNP Primary GI doctor: Dr. Lyndel Safe  ASSESSMENT AND PLAN:  Uncontrolled type 2 diabetes mellitus with hyperglycemia ([place]) Get better control of sugars, explained would not be able to do colon/EGD if sugars are too elevated, follow up with PCP.  May benefit from GES after EGD   Loss of weight Normal CT AB and pelvis Possible from depression/anxiety, being treated by PCP Improved after ABX, possible SIBO treated Will get endoscopic evaluation May consider GES pending results  Diarrhea with history of rectal abscess Will check inflammatory labs, will proceed with colonoscopy for evaluation.  If negative possible from IBS/DM/metformin Given information, continue bentyl  Nausea and vomiting with dysphagia, GERD Improving after H pylori treatment, unable to see labs Will schedule EGD for eradication, dysphagia and weight loss Last dose ABX/PPI was 01/23/2022  Anemia normocytic Check for iron def, if positive, continue with EGD/colon  Soft blood pressure No dizziness, symptoms Will check labs ER precautions given  Patient Care Team: Nolene Ebbs, MD as PCP - General (Internal Medicine)  HISTORY OF PRESENT ILLNESS: 36 y.o. 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  Reviewing patient's chart appears he has had chronic fatigue for about 2 years with work-up by PCP.  Patient's had multiple ER visits for different complaints, has history of depression/anxiety and has had multiple medications for this. 01/11/2022 ER visit for abdominal pain with nausea vomiting and diarrhea. Hemoglobin 11.8, has chronic anemia with normal MCV. ALT 52, AST 31, glucose 253, sodium 130, potassium normal, lipase normal CT abdomen pelvis with  contrast in the ER showed fluid in the left colon and rectum consistent with provided history of diarrheal illness unchanged hepatic hemangioma, unremarkable gallbladder, no biliary dilatation, unremarkable pancreas and spleen no evidence of colitis or obstruction. 10/05/2021  unremarkable CT abdomen pelvis with contrast  05/31/2021  CT abdomen pelvis that showed perirectal abscess, hepatic steatosis.    He has been having a lot of gas, bloating, flatulance for 3-4 years.  Then last 2 years he has had depression due to wife leaving him.  Last 6 months has had decreased appetite with weight loss.  He had diarrhea, watery, can have 20 times a day, will have fecal incontinence and will have nocturnal symptoms with fecal incontinence. Was wearing a diaper.  He states he tested positive for H pylori with blood with PCP, started on Augmentin, clarithromycin, and flagyl with prilosec.  After he completed this he feels that his diarrhea has improved and his bloating improved. His weight and energy is up.  He however continues to have BM every 4-5 days.  He was having GERD, hiccups, and was having nausea with vomiting every day for 2 months, started in morning, not associated with food.  Denies melena or hematochezia. Does have rectal pain and occ feels that the abscess is back.  Intermittent dysphagia, has improved.  He still gets full quickly, no nausea, vomtiing with eating.  Does not know family history, grew up in [place].   Wt Readings from Last 3 Encounters:  01/30/22 126 lb (57.2 kg)  10/05/21 133 lb (60.3 kg)  05/31/21 150 lb (68 kg)      Current Medications:   Current Outpatient Medications (Endocrine & Metabolic):    metFORMIN (GLUCOPHAGE) 1000 MG tablet, Take 1,000 mg by  mouth 2 (two) times daily.   insulin glargine (LANTUS SOLOSTAR) 100 UNIT/ML Solostar Pen, Inject 14 Units into the skin daily. (Patient not taking: Reported on 01/11/2022)  Current Outpatient  Medications (Cardiovascular):    ramipril (ALTACE) 5 MG capsule, Take 5 mg by mouth daily. (Patient not taking: Reported on 01/30/2022)   rosuvastatin (CRESTOR) 5 MG tablet, Take 5 mg by mouth daily. (Patient not taking: Reported on 01/30/2022)   Current Outpatient Medications (Analgesics):    acetaminophen (TYLENOL) 500 MG tablet, Take 2 tablets (1,000 mg total) by mouth every 6 (six) hours as needed for mild pain.   aspirin EC 81 MG tablet, Take 81 mg by mouth daily. Swallow whole. (Patient not taking: Reported on 01/30/2022)   oxyCODONE (OXY IR/ROXICODONE) 5 MG immediate release tablet, Take 1 tablet (5 mg total) by mouth every 6 (six) hours as needed for severe pain. (Patient not taking: Reported on 01/11/2022)  Current Outpatient Medications (Hematological):    cyanocobalamin (,VITAMIN B-12,) 1000 MCG/ML injection, Inject 1,000 mcg into the muscle every 30 (thirty) days.  Current Outpatient Medications (Other):    blood glucose meter kit and supplies, Dispense based on patient and insurance preference. Use up to four times daily as directed. (FOR ICD-10 E10.9, E11.9).   dicyclomine (BENTYL) 20 MG tablet, Take 1 tablet (20 mg total) by mouth 3 (three) times daily as needed for spasms.   famotidine (PEPCID) 20 MG tablet, Take 1 tablet (20 mg total) by mouth 2 (two) times daily.   gabapentin (NEURONTIN) 300 MG capsule, Take 300 mg by mouth 3 (three) times daily.   Insulin Pen Needle 32G X 4 MM MISC, Use 1 box as needed   loperamide (IMODIUM) 2 MG capsule, Take 1 capsule (2 mg total) by mouth 4 (four) times daily as needed for diarrhea or loose stools.   amoxicillin-clavulanate (AUGMENTIN) 875-125 MG tablet, Take 1 tablet by mouth every 12 (twelve) hours. (Patient not taking: Reported on 01/11/2022)   clarithromycin (BIAXIN) 500 MG tablet, Take 500 mg by mouth 2 (two) times daily. (Patient not taking: Reported on 01/30/2022)   megestrol (MEGACE) 40 MG tablet, Take 40 mg by mouth 2 (two) times daily.  (Patient not taking: Reported on 01/30/2022)   metroNIDAZOLE (FLAGYL) 500 MG tablet, Take 500 mg by mouth every 8 (eight) hours. (Patient not taking: Reported on 01/30/2022)   omeprazole (PRILOSEC) 20 MG capsule, Take 20 mg by mouth every 12 (twelve) hours. (Patient not taking: Reported on 01/30/2022)   ondansetron (ZOFRAN) 4 MG tablet, Take 1 tablet (4 mg total) by mouth every 6 (six) hours. (Patient not taking: Reported on 01/11/2022)   ondansetron (ZOFRAN-ODT) 4 MG disintegrating tablet, Take 1 tablet (4 mg total) by mouth every 8 (eight) hours as needed for nausea or vomiting. (Patient not taking: Reported on 01/30/2022)   Polyethylene Glycol 3350 (PEG 3350) 17 GM/SCOOP POWD, Take 17 g by mouth in the morning and at bedtime. (Patient not taking: Reported on 01/11/2022)   sucralfate (CARAFATE) 1 g tablet, Take 1 g by mouth 4 (four) times daily. (Patient not taking: Reported on 01/11/2022)   tamsulosin (FLOMAX) 0.4 MG CAPS capsule, Take 1 capsule (0.4 mg total) by mouth daily. (Patient not taking: Reported on 01/11/2022)   traZODone (DESYREL) 50 MG tablet, Take 50 mg by mouth at bedtime. (Patient not taking: Reported on 01/30/2022)  Medical History:  Past Medical History:  Diagnosis Date   Depression    Diabetes mellitus without complication ([place])    age 85  yo at diagnosis   ED (erectile dysfunction)    GERD (gastroesophageal reflux disease)    Hepatitis B    Hyperlipidemia    Neuropathy    Allergies: No Known Allergies   Surgical History:  He  has a past surgical history that includes Incision and drainage perirectal abscess (N/A, 05/31/2021). Family History:  His family history is not on file. Social History:   reports that he has never smoked. His smokeless tobacco use includes chew. He reports that he does not currently use alcohol after a past usage of about 24.0 standard drinks of alcohol per week. He reports that he does not use drugs.  REVIEW OF SYSTEMS  : All other systems reviewed  and negative except where noted in the History of Present Illness.   PHYSICAL EXAM: BP (!) 80/62   Pulse 77   Ht 5' 7"  (1.702 m)   Wt 126 lb (57.2 kg)   SpO2 97%   BMI 19.73 kg/m  General:   Pleasant, well developed male in no acute distress Head:   Normocephalic and atraumatic. Eyes:  sclerae anicteric,conjunctive pink  Heart:   regular rate and rhythm Pulm:  Clear anteriorly; no wheezing Abdomen:   Soft, Flat AB, Active bowel sounds. mild tenderness in the epigastrium. Without guarding and Without rebound, No organomegaly appreciated. Rectal: declines Extremities:  Without edema, tattoos down arms Msk: Symmetrical without gross deformities. Peripheral pulses intact.  Neurologic:  Alert and  oriented x4;  No focal deficits.  Skin:   Dry and intact without significant lesions or rashes. Psychiatric:  Cooperative. Normal mood and affect.    Vladimir Crofts, PA-C 11:15 AM

## 2022-01-30 ENCOUNTER — Ambulatory Visit (INDEPENDENT_AMBULATORY_CARE_PROVIDER_SITE_OTHER): Payer: Medicaid Other | Admitting: Physician Assistant

## 2022-01-30 ENCOUNTER — Encounter: Payer: Self-pay | Admitting: Physician Assistant

## 2022-01-30 ENCOUNTER — Other Ambulatory Visit (INDEPENDENT_AMBULATORY_CARE_PROVIDER_SITE_OTHER): Payer: Medicaid Other

## 2022-01-30 VITALS — BP 80/62 | HR 77 | Ht 67.0 in | Wt 126.0 lb

## 2022-01-30 DIAGNOSIS — D649 Anemia, unspecified: Secondary | ICD-10-CM | POA: Diagnosis not present

## 2022-01-30 DIAGNOSIS — R197 Diarrhea, unspecified: Secondary | ICD-10-CM

## 2022-01-30 DIAGNOSIS — R1310 Dysphagia, unspecified: Secondary | ICD-10-CM

## 2022-01-30 DIAGNOSIS — R634 Abnormal weight loss: Secondary | ICD-10-CM

## 2022-01-30 DIAGNOSIS — R112 Nausea with vomiting, unspecified: Secondary | ICD-10-CM

## 2022-01-30 DIAGNOSIS — E1165 Type 2 diabetes mellitus with hyperglycemia: Secondary | ICD-10-CM

## 2022-01-30 LAB — COMPREHENSIVE METABOLIC PANEL
ALT: 35 U/L (ref 0–53)
AST: 18 U/L (ref 0–37)
Albumin: 4.2 g/dL (ref 3.5–5.2)
Alkaline Phosphatase: 55 U/L (ref 39–117)
BUN: 11 mg/dL (ref 6–23)
CO2: 24 mEq/L (ref 19–32)
Calcium: 9.3 mg/dL (ref 8.4–10.5)
Chloride: 100 mEq/L (ref 96–112)
Creatinine, Ser: 0.6 mg/dL (ref 0.40–1.50)
GFR: 124.77 mL/min (ref >60.00)
Glucose, Bld: 231 mg/dL — ABNORMAL HIGH (ref 70–99)
Potassium: 4.3 mEq/L (ref 3.5–5.1)
Sodium: 135 mEq/L (ref 135–145)
Total Bilirubin: 0.7 mg/dL (ref 0.2–1.2)
Total Protein: 7.1 g/dL (ref 6.0–8.3)

## 2022-01-30 LAB — CBC WITH DIFFERENTIAL/PLATELET
Basophils Absolute: 0 10*3/uL (ref 0.0–0.1)
Basophils Relative: 0.7 % (ref 0.0–3.0)
Eosinophils Absolute: 0.1 10*3/uL (ref 0.0–0.7)
Eosinophils Relative: 1.7 % (ref 0.0–5.0)
HCT: 35.9 % — ABNORMAL LOW (ref 39.0–52.0)
Hemoglobin: 12.3 g/dL — ABNORMAL LOW (ref 13.0–17.0)
Lymphocytes Relative: 34.1 % (ref 12.0–46.0)
Lymphs Abs: 1.9 10*3/uL (ref 0.7–4.0)
MCHC: 34.2 g/dL (ref 30.0–36.0)
MCV: 90.3 fl (ref 78.0–100.0)
Monocytes Absolute: 0.4 10*3/uL (ref 0.1–1.0)
Monocytes Relative: 6.5 % (ref 3.0–12.0)
Neutro Abs: 3.1 10*3/uL (ref 1.4–7.7)
Neutrophils Relative %: 57 % (ref 43.0–77.0)
Platelets: 246 10*3/uL (ref 150.0–400.0)
RBC: 3.98 Mil/uL — ABNORMAL LOW (ref 4.22–5.81)
RDW: 13.9 % (ref 11.5–15.5)
WBC: 5.4 10*3/uL (ref 4.0–10.5)

## 2022-01-30 LAB — IBC + FERRITIN
Ferritin: 221 ng/mL (ref 22.0–322.0)
Iron: 115 ug/dL (ref 42–165)
Saturation Ratios: 39.1 % (ref 20.0–50.0)
TIBC: 294 ug/dL (ref 250.0–450.0)
Transferrin: 210 mg/dL — ABNORMAL LOW (ref 212.0–360.0)

## 2022-01-30 LAB — HIGH SENSITIVITY CRP: CRP, High Sensitivity: 0.63 mg/L (ref 0.000–5.000)

## 2022-01-30 LAB — SEDIMENTATION RATE: Sed Rate: 19 mm/hr — ABNORMAL HIGH (ref 0–15)

## 2022-01-30 MED ORDER — NA SULFATE-K SULFATE-MG SULF 17.5-3.13-1.6 GM/177ML PO SOLN: 1.0000 | Freq: Once | ORAL | 0 refills | Status: AC

## 2022-01-30 NOTE — Patient Instructions (Addendum)
If you are age 36 or younger, your body mass index should be between 19-25. Your Body mass index is 19.73 kg/m. If this is out of the aformentioned range listed, please consider follow up with your Primary Care Provider.  ________________________________________________________  The [facility] providers would like to encourage you to use [facility] to communicate with providers for non-urgent requests or questions.  Due to long hold times on the telephone, sending your provider a message by [facility] may be a faster and more efficient way to get a response.  Please allow 48 business hours for a response.  Please remember that this is for non-urgent requests.  _______________________________________________________  Gregory Campos have been scheduled for an endoscopy and colonoscopy. Please follow the written instructions given to you at your visit today. Please pick up your prep supplies at the pharmacy within the next 1-3 days. If you use inhalers (even only as needed), please bring them with you on the day of your procedure.  Your provider has requested that you go to the basement level for lab work before leaving today. Press "B" on the elevator. The lab is located at the first door on the left as you exit the elevator.   Please avoid milk products, raw fruits, raw vegetables, high fat foods, artifical sweeteners, and carbonated beverages until symptoms resolve Do BRAT diet Go to the ER if any severe abdominal pain, fever, or weakness  Follow up pending at this time.  Thank you for entrusting me with your care and choosing [facility].  Quentin Mulling, PA-C  [facility] Diet A bland diet consists of foods that are often soft and do not have a lot of fat, fiber, or extra seasonings. Foods without fat, fiber, or seasoning are easier for the body to digest. They are also less likely to irritate your mouth, throat, stomach, and other parts of your digestive system. A bland diet is sometimes called a BRAT  diet. What is my plan? Your health care provider or food and nutrition specialist (dietitian) may recommend specific changes to your diet to prevent symptoms or to treat your symptoms. These changes may include: Eating small meals often. Cooking food until it is soft enough to chew easily. Chewing your food well. Drinking fluids slowly. Not eating foods that are very spicy, sour, or fatty. Not eating citrus fruits, such as oranges and grapefruit. What do I need to know about this diet? Eat a variety of foods from the bland diet food list. Do not follow a bland diet longer than needed. Ask your health care provider whether you should take vitamins or supplements. What foods can I eat? Grains Hot cereals, such as cream of wheat. Rice. Bread, crackers, or tortillas made from refined white flour. Vegetables Canned or cooked vegetables. Mashed or boiled potatoes. Fruits Bananas. Applesauce. Other types of cooked or canned fruit with the skin and seeds removed, such as canned peaches or pears. Meats and other proteins Scrambled eggs. Creamy peanut butter or other nut butters. Lean, well-cooked meats, such as chicken or fish. Tofu. Soups or broths. Dairy Low-fat dairy products, such as milk, cottage cheese, or yogurt. Beverages Water. Herbal tea. Apple juice. Fats and oils Mild salad dressings. Canola or olive oil. Sweets and desserts Pudding. Custard. Fruit gelatin. Ice cream. The items listed above may not be a complete list of recommended foods and beverages. Contact a dietitian for more options. What foods are not recommended? Grains Whole grain breads and cereals. Vegetables Raw vegetables. Fruits Raw fruits,  especially citrus, berries, or dried fruits. Dairy Whole fat dairy foods. Beverages Caffeinated drinks. Alcohol. Seasonings and condiments Strongly flavored seasonings or condiments. Hot sauce. Salsa. Other foods Spicy foods. Fried foods. Sour foods, such as pickled or  fermented foods. Foods with high sugar content. Foods high in fiber. The items listed above may not be a complete list of foods and beverages to avoid. Contact a dietitian for more information. Summary A bland diet consists of foods that are often soft and do not have a lot of fat, fiber, or extra seasonings. Foods without fat, fiber, or seasoning are easier for the body to digest. Check with your health care provider to see how long you should follow this diet plan. It is not meant to be followed for long periods. This information is not intended to replace advice given to you by your health care provider. Make sure you discuss any questions you have with your health care provider. Document Revised: 07/31/2017 Document Reviewed: 07/31/2017 Elsevier Patient Education  2022 [employer].

## 2022-01-31 LAB — IGA: Immunoglobulin A: 295 mg/dL (ref 47–310)

## 2022-01-31 LAB — TISSUE TRANSGLUTAMINASE, IGA: (tTG) Ab, IgA: <1 U/mL

## 2022-01-31 NOTE — Progress Notes (Signed)
Agree with assessment/plan.  Raj Suzzette Gasparro, MD [facility] [phone]  

## 2022-02-08 ENCOUNTER — Telehealth: Payer: Self-pay

## 2022-02-08 NOTE — Telephone Encounter (Signed)
Patient returned call in regards to lab work. Pt given results & verbalized all understanding.

## 2022-03-06 ENCOUNTER — Encounter: Payer: Self-pay | Admitting: Gastroenterology

## 2022-03-06 ENCOUNTER — Ambulatory Visit (AMBULATORY_SURGERY_CENTER): Payer: Medicaid Other | Admitting: Gastroenterology

## 2022-03-06 VITALS — BP 97/67 | HR 75 | Temp 98.7°F | Resp 12 | Ht 67.0 in | Wt 126.0 lb

## 2022-03-06 DIAGNOSIS — R197 Diarrhea, unspecified: Secondary | ICD-10-CM

## 2022-03-06 DIAGNOSIS — K2960 Other gastritis without bleeding: Secondary | ICD-10-CM

## 2022-03-06 DIAGNOSIS — K529 Noninfective gastroenteritis and colitis, unspecified: Secondary | ICD-10-CM | POA: Diagnosis not present

## 2022-03-06 DIAGNOSIS — R1013 Epigastric pain: Secondary | ICD-10-CM

## 2022-03-06 DIAGNOSIS — R1310 Dysphagia, unspecified: Secondary | ICD-10-CM

## 2022-03-06 DIAGNOSIS — K2950 Unspecified chronic gastritis without bleeding: Secondary | ICD-10-CM | POA: Diagnosis not present

## 2022-03-06 DIAGNOSIS — B9681 Helicobacter pylori [H. pylori] as the cause of diseases classified elsewhere: Secondary | ICD-10-CM | POA: Diagnosis not present

## 2022-03-06 MED ORDER — PANTOPRAZOLE SODIUM 20 MG PO TBEC: 20.0000 mg | DELAYED_RELEASE_TABLET | Freq: Every day | ORAL | 4 refills | Status: AC

## 2022-03-06 MED ORDER — SODIUM CHLORIDE 0.9 % IV SOLN: 500.0000 mL | Freq: Once | INTRAVENOUS | Status: AC

## 2022-03-06 NOTE — Progress Notes (Signed)
01/30/2022 Quintavius Niebuhr [id number] 16-Dec-1985   Referring provider: Vonna Drafts, FNP Primary GI doctor: Dr. Lyndel Safe   ASSESSMENT AND PLAN:   Uncontrolled type 2 diabetes mellitus with hyperglycemia ([place]) Get better control of sugars, explained would not be able to do colon/EGD if sugars are too elevated, follow up with PCP.  May benefit from GES after EGD    Loss of weight Normal CT AB and pelvis Possible from depression/anxiety, being treated by PCP Improved after ABX, possible SIBO treated Will get endoscopic evaluation May consider GES pending results   Diarrhea with history of rectal abscess Will check inflammatory labs, will proceed with colonoscopy for evaluation.  If negative possible from IBS/DM/metformin Given information, continue bentyl   Nausea and vomiting with dysphagia, GERD Improving after H pylori treatment, unable to see labs Will schedule EGD for eradication, dysphagia and weight loss Last dose ABX/PPI was 01/23/2022   Anemia normocytic Check for iron def, if positive, continue with EGD/colon   Soft blood pressure No dizziness, symptoms Will check labs ER precautions given   Patient Care Team: Nolene Ebbs, MD as PCP - General (Internal Medicine)   HISTORY OF PRESENT ILLNESS: 36 y.o. 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  Reviewing patient's chart appears he has had chronic fatigue for about 2 years with work-up by PCP.   Patient's had multiple ER visits for different complaints, has history of depression/anxiety and has had multiple medications for this. 01/11/2022 ER visit for abdominal pain with nausea vomiting and diarrhea. Hemoglobin 11.8, has chronic anemia with normal MCV. ALT 52, AST 31, glucose 253, sodium 130, potassium normal, lipase normal CT abdomen pelvis  with contrast in the ER showed fluid in the left colon and rectum consistent with provided history of diarrheal illness unchanged hepatic hemangioma, unremarkable gallbladder, no biliary dilatation, unremarkable pancreas and spleen no evidence of colitis or obstruction. 10/05/2021  unremarkable CT abdomen pelvis with contrast  05/31/2021  CT abdomen pelvis that showed perirectal abscess, hepatic steatosis.     He has been having a lot of gas, bloating, flatulance for 3-4 years.  Then last 2 years he has had depression due to wife leaving him.  Last 6 months has had decreased appetite with weight loss.  He had diarrhea, watery, can have 20 times a day, will have fecal incontinence and will have nocturnal symptoms with fecal incontinence. Was wearing a diaper.  He states he tested positive for H pylori with blood with PCP, started on Augmentin, clarithromycin, and flagyl with prilosec.  After he completed this he feels that his diarrhea has improved and his bloating improved. His weight and energy is up.  He however continues to have BM every 4-5 days.  He was having GERD, hiccups, and was having nausea with vomiting every day for 2 months, started in morning, not associated with food.  Denies melena or hematochezia. Does have rectal pain and occ feels that the abscess is back.  Intermittent dysphagia, has improved.  He still gets full quickly, no nausea, vomtiing with eating.  Does not know family history, grew up in [place].       Wt Readings from Last 3 Encounters:  01/30/22 126 lb (57.2 kg)  10/05/21 133 lb (60.3 kg)  05/31/21 150 lb (68 kg)          Current Medications:  Current Outpatient Medications (Endocrine & Metabolic):    metFORMIN (GLUCOPHAGE) 1000 MG tablet, Take 1,000 mg by mouth 2 (two) times daily.   insulin glargine (LANTUS SOLOSTAR) 100 UNIT/ML Solostar Pen, Inject 14 Units into the skin daily. (Patient not taking: Reported on 01/11/2022)   Current  Outpatient Medications (Cardiovascular):    ramipril (ALTACE) 5 MG capsule, Take 5 mg by mouth daily. (Patient not taking: Reported on 01/30/2022)   rosuvastatin (CRESTOR) 5 MG tablet, Take 5 mg by mouth daily. (Patient not taking: Reported on 01/30/2022)     Current Outpatient Medications (Analgesics):    acetaminophen (TYLENOL) 500 MG tablet, Take 2 tablets (1,000 mg total) by mouth every 6 (six) hours as needed for mild pain.   aspirin EC 81 MG tablet, Take 81 mg by mouth daily. Swallow whole. (Patient not taking: Reported on 01/30/2022)   oxyCODONE (OXY IR/ROXICODONE) 5 MG immediate release tablet, Take 1 tablet (5 mg total) by mouth every 6 (six) hours as needed for severe pain. (Patient not taking: Reported on 01/11/2022)   Current Outpatient Medications (Hematological):    cyanocobalamin (,VITAMIN B-12,) 1000 MCG/ML injection, Inject 1,000 mcg into the muscle every 30 (thirty) days.   Current Outpatient Medications (Other):    blood glucose meter kit and supplies, Dispense based on patient and insurance preference. Use up to four times daily as directed. (FOR ICD-10 E10.9, E11.9).   dicyclomine (BENTYL) 20 MG tablet, Take 1 tablet (20 mg total) by mouth 3 (three) times daily as needed for spasms.   famotidine (PEPCID) 20 MG tablet, Take 1 tablet (20 mg total) by mouth 2 (two) times daily.   gabapentin (NEURONTIN) 300 MG capsule, Take 300 mg by mouth 3 (three) times daily.   Insulin Pen Needle 32G X 4 MM MISC, Use 1 box as needed   loperamide (IMODIUM) 2 MG capsule, Take 1 capsule (2 mg total) by mouth 4 (four) times daily as needed for diarrhea or loose stools.   amoxicillin-clavulanate (AUGMENTIN) 875-125 MG tablet, Take 1 tablet by mouth every 12 (twelve) hours. (Patient not taking: Reported on 01/11/2022)   clarithromycin (BIAXIN) 500 MG tablet, Take 500 mg by mouth 2 (two) times daily. (Patient not taking: Reported on 01/30/2022)   megestrol (MEGACE) 40 MG tablet, Take 40 mg by mouth 2  (two) times daily. (Patient not taking: Reported on 01/30/2022)   metroNIDAZOLE (FLAGYL) 500 MG tablet, Take 500 mg by mouth every 8 (eight) hours. (Patient not taking: Reported on 01/30/2022)   omeprazole (PRILOSEC) 20 MG capsule, Take 20 mg by mouth every 12 (twelve) hours. (Patient not taking: Reported on 01/30/2022)   ondansetron (ZOFRAN) 4 MG tablet, Take 1 tablet (4 mg total) by mouth every 6 (six) hours. (Patient not taking: Reported on 01/11/2022)   ondansetron (ZOFRAN-ODT) 4 MG disintegrating tablet, Take 1 tablet (4 mg total) by mouth every 8 (eight) hours as needed for nausea or vomiting. (Patient not taking: Reported on 01/30/2022)   Polyethylene Glycol 3350 (PEG 3350) 17 GM/SCOOP POWD, Take 17 g by mouth in the morning and at bedtime. (Patient not taking: Reported on 01/11/2022)   sucralfate (CARAFATE) 1 g tablet, Take 1 g by mouth 4 (four) times daily. (Patient not taking: Reported on 01/11/2022)   tamsulosin (FLOMAX) 0.4 MG CAPS capsule, Take 1 capsule (0.4 mg total) by mouth daily. (Patient not taking: Reported on 01/11/2022)   traZODone (DESYREL) 50 MG tablet, Take 50 mg by mouth at bedtime. (Patient not taking: Reported on 01/30/2022)   Medical  History:      Past Medical History:  Diagnosis Date   Depression     Diabetes mellitus without complication ([place])      age 62 yo at diagnosis   ED (erectile dysfunction)     GERD (gastroesophageal reflux disease)     Hepatitis B     Hyperlipidemia     Neuropathy      Allergies: No Known Allergies    Surgical History:  He  has a past surgical history that includes Incision and drainage perirectal abscess (N/A, 05/31/2021). Family History:  His family history is not on file. Social History:   reports that he has never smoked. His smokeless tobacco use includes chew. He reports that he does not currently use alcohol after a past usage of about 24.0 standard drinks of alcohol per week. He reports that he does not use drugs.   REVIEW OF  SYSTEMS  : All other systems reviewed and negative except where noted in the History of Present Illness.     PHYSICAL EXAM: BP (!) 80/62   Pulse 77   Ht _0  (1.702 m)   Wt 126 lb (57.2 kg)   SpO2 97%   BMI 19.73 kg/m  General:   Pleasant, well developed male in no acute distress Head:   Normocephalic and atraumatic. Eyes:  sclerae anicteric,conjunctive pink  Heart:   regular rate and rhythm Pulm:  Clear anteriorly; no wheezing Abdomen:   Soft, Flat AB, Active bowel sounds. mild tenderness in the epigastrium. Without guarding and Without rebound, No organomegaly appreciated. Rectal: declines Extremities:  Without edema, tattoos down arms Msk: Symmetrical without gross deformities. Peripheral pulses intact.  Neurologic:  Alert and  oriented x4;  No focal deficits.  Skin:   Dry and intact without significant lesions or rashes. Psychiatric:  Cooperative. Normal mood and affect.       Vladimir Crofts, PA-C    Attending physician's note   I have taken history, reviewed the chart and examined the patient.    For EGD/colon today.    Carmell Austria, MD [facility] [phone]

## 2022-03-06 NOTE — Progress Notes (Signed)
Sedate, gd SR, tolerated procedure well, VSS, report to RN 

## 2022-03-06 NOTE — Patient Instructions (Addendum)
Resume previous medications.  Await results for final recommendations.  Handouts on findings given to patient.   ( Gastritis, hemorrhoids)  If any further rectal bleeding, [place] Preparation H twice daily after bowel movements for 7 to 10 days  Repeat colonoscopy in 10 years.  Prescription for Protonix sent to [facility] on [address]    YOU HAD AN ENDOSCOPIC PROCEDURE TODAY AT THE [facility]:   Refer to the procedure report that was given to you for any specific questions about what was found during the examination.  If the procedure report does not answer your questions, please call your gastroenterologist to clarify.  If you requested that your care partner not be given the details of your procedure findings, then the procedure report has been included in a sealed envelope for you to review at your convenience later.  YOU SHOULD EXPECT: Some feelings of bloating in the abdomen. Passage of more gas than usual.  Walking can help get rid of the air that was put into your GI tract during the procedure and reduce the bloating. If you had a lower endoscopy (such as a colonoscopy or flexible sigmoidoscopy) you may notice spotting of blood in your stool or on the toilet paper. If you underwent a bowel prep for your procedure, you may not have a normal bowel movement for a few days.  Please Note:  You might notice some irritation and congestion in your nose or some drainage.  This is from the oxygen used during your procedure.  There is no need for concern and it should clear up in a day or so.  SYMPTOMS TO REPORT IMMEDIATELY:  Following lower endoscopy (colonoscopy or flexible sigmoidoscopy):  Excessive amounts of blood in the stool  Significant tenderness or worsening of abdominal pains  Swelling of the abdomen that is new, acute  Fever of 100F or higher  Following upper endoscopy (EGD)  Vomiting of blood or coffee ground material  New chest pain or pain under the shoulder  blades  Painful or persistently difficult swallowing  New shortness of breath  Fever of 100F or higher  Black, tarry-looking stools  For urgent or emergent issues, a gastroenterologist can be reached at any hour by calling [phone]. Do not use MyChart messaging for urgent concerns.    DIET:  We do recommend a small meal at first, but then you may proceed to your regular diet.  Drink plenty of fluids but you should avoid alcoholic beverages for 24 hours.  ACTIVITY:  You should plan to take it easy for the rest of today and you should NOT DRIVE or use heavy machinery until tomorrow (because of the sedation medicines used during the test).    FOLLOW UP: Our staff will call the number listed on your records the next business day following your procedure.  We will call around 7:15- 8:00 am to check on you and address any questions or concerns that you may have regarding the information given to you following your procedure. If we do not reach you, we will leave a message.  If you develop any symptoms (ie: fever, flu-like symptoms, shortness of breath, cough etc.) before then, please call [phone].  If you test positive for Covid 19 in the 2 weeks post procedure, please call and report this information to [place].    If any biopsies were taken you will be contacted by phone or by letter within the next 1-3 weeks.  Please call us at [phone] if you  have not heard about the biopsies in 3 weeks.    SIGNATURES/CONFIDENTIALITY: You and/or your care partner have signed paperwork which will be entered into your electronic medical record.  These signatures attest to the fact that that the information above on your After Visit Summary has been reviewed and is understood.  Full responsibility of the confidentiality of this discharge information lies with you and/or your care-partner.

## 2022-03-06 NOTE — Op Note (Signed)
[facility] Patient Name: Gregory Campos Procedure Date: 03/06/2022 11:05 AM MRN: [id number] Endoscopist: Jackquline Denmark , MD Age: 36 Referring MD:  Date of Birth: [birth year] Gender: Male Account #: [id number] Procedure:                Upper GI endoscopy Indications:              Epigastric abdominal pain. No further dysphagia Medicines:                Monitored Anesthesia Care Procedure:                Pre-Anesthesia Assessment:                           - Prior to the procedure, a History and Physical                            was performed, and patient medications and                            allergies were reviewed. The patient's tolerance of                            previous anesthesia was also reviewed. The risks                            and benefits of the procedure and the sedation                            options and risks were discussed with the patient.                            All questions were answered, and informed consent                            was obtained. Prior Anticoagulants: The patient has                            taken no previous anticoagulant or antiplatelet                            agents. ASA Grade Assessment: II - A patient with                            mild systemic disease. After reviewing the risks                            and benefits, the patient was deemed in                            satisfactory condition to undergo the procedure.                           After obtaining informed consent, the endoscope was  passed under direct vision. Throughout the                            procedure, the patient's blood pressure, pulse, and                            oxygen saturations were monitored continuously. The                            GIF [id number] #[id number] was introduced through the                            mouth, and advanced to the second part of duodenum.                            The  upper GI endoscopy was accomplished without                            difficulty. The patient tolerated the procedure                            well. Scope In: Scope Out: Findings:                 The examined esophagus was normal with well-defined                            Z-line at 36 cm, examined by NBI. Biopsies were                            obtained from the proximal and distal esophagus                            with cold forceps for histology to r/o eosinophilic                            esophagitis.                           Scattered mild inflammation characterized by                            erythema was found in the entire examined stomach.                            Biopsies were taken with a cold forceps for                            histology.                           The examined duodenum was normal. Biopsies for                            histology were taken with a cold forceps for  evaluation of celiac disease. Complications:            No immediate complications. Estimated Blood Loss:     Estimated blood loss: none. Impression:               - Mild gastritis. Recommendation:           - Patient has a contact number available for                            emergencies. The signs and symptoms of potential                            delayed complications were discussed with the                            patient. Return to normal activities tomorrow.                            Written discharge instructions were provided to the                            patient.                           - Resume previous diet.                           - Continue present medications.                           - Start Protonix 20 mg p.o. once a day # 90. 4RF                           - Await pathology results.                           - The findings and recommendations were discussed                            with the patient's family. Lynann Bologna, MD 03/06/2022 11:47:56 AM This report has been signed electronically.

## 2022-03-06 NOTE — Progress Notes (Signed)
Pt d/c'd via wheelchair.  Taken to car by transporters.  After taking him to the car, transporters witnessed patient get in driver's seat and drive away.

## 2022-03-06 NOTE — Progress Notes (Signed)
Called to room to assist during endoscopic procedure.  Patient ID and intended procedure confirmed with present staff. Received instructions for my participation in the procedure from the performing physician.  

## 2022-03-06 NOTE — Op Note (Signed)
[facility] Patient Name: Gregory Campos Procedure Date: 03/06/2022 11:06 AM MRN: [id number] Endoscopist: Lynann Bologna , MD Age: 36 Referring MD:  Date of Birth: 03/30/1986 Gender: Male Account #: [id number] Procedure:                Colonoscopy Indications:              Clinically significant diarrhea of unexplained                            origin Medicines:                Monitored Anesthesia Care Procedure:                Pre-Anesthesia Assessment:                           - Prior to the procedure, a History and Physical                            was performed, and patient medications and                            allergies were reviewed. The patient's tolerance of                            previous anesthesia was also reviewed. The risks                            and benefits of the procedure and the sedation                            options and risks were discussed with the patient.                            All questions were answered, and informed consent                            was obtained. Prior Anticoagulants: The patient has                            taken no previous anticoagulant or antiplatelet                            agents. ASA Grade Assessment: II - A patient with                            mild systemic disease. After reviewing the risks                            and benefits, the patient was deemed in                            satisfactory condition to undergo the procedure.  After obtaining informed consent, the colonoscope                            was passed under direct vision. Throughout the                            procedure, the patient's blood pressure, pulse, and                            oxygen saturations were monitored continuously. The                            Olympus [id number]-[id number] (#[id number]) Colonoscope was                            introduced through the anus and advanced to the 4                             cm into the ileum. The colonoscopy was performed                            without difficulty. The patient tolerated the                            procedure well. The quality of the bowel                            preparation was good. The terminal ileum, ileocecal                            valve, appendiceal orifice, and rectum were                            photographed. Scope In: 11:50:02 AM Scope Out: 12:01:43 PM Scope Withdrawal Time: 0 hours 8 minutes 26 seconds  Total Procedure Duration: 0 hours 11 minutes 41 seconds  Findings:                 The colon (entire examined portion) appeared                            normal. Biopsies for histology were taken with a                            cold forceps from the entire colon for evaluation                            of microscopic colitis.                           Non-bleeding internal hemorrhoids were found during                            retroflexion. The hemorrhoids were small and Grade  I (internal hemorrhoids that do not prolapse).                           The terminal ileum appeared normal. Biopsies were                            taken with a cold forceps for histology.                           The exam was otherwise without abnormality on                            direct and retroflexion views. Complications:            No immediate complications. Estimated Blood Loss:     Estimated blood loss: none. Impression:               - Non-bleeding internal hemorrhoids.                           - Otherwise normal colonoscopy to TI. Recommendation:           - Patient has a contact number available for                            emergencies. The signs and symptoms of potential                            delayed complications were discussed with the                            patient. Return to normal activities tomorrow.                            Written discharge  instructions were provided to the                            patient.                           - Resume previous diet.                           - Continue present medications.                           - Await pathology results.                           - Repeat colonoscopy in 10 years for screening                            purposes. Earlier, if with any new problems or                            change in family history.                           -  If any further rectal bleeding, use Preparation H                            1 twice daily after the bowel movements for 7 to 10                            days.                           - The findings and recommendations were discussed                            with the patient's family. Lynann Bologna, MD 03/06/2022 12:06:02 PM This report has been signed electronically.

## 2022-03-07 ENCOUNTER — Telehealth: Payer: Self-pay | Admitting: *Deleted

## 2022-03-07 NOTE — Telephone Encounter (Signed)
Attempt for follow up phone call. No answer at number given.  Voicemail has not been set up yet.

## 2022-03-17 ENCOUNTER — Encounter: Payer: Self-pay | Admitting: Gastroenterology

## 2022-03-20 ENCOUNTER — Other Ambulatory Visit: Payer: Self-pay

## 2022-03-20 DIAGNOSIS — B9681 Helicobacter pylori [H. pylori] as the cause of diseases classified elsewhere: Secondary | ICD-10-CM

## 2022-03-20 MED ORDER — PANTOPRAZOLE SODIUM 40 MG PO TBEC: 40.0000 mg | DELAYED_RELEASE_TABLET | Freq: Two times a day (BID) | ORAL | 0 refills | Status: AC

## 2022-03-20 MED ORDER — METRONIDAZOLE 250 MG PO TABS: 250.0000 mg | ORAL_TABLET | Freq: Four times a day (QID) | ORAL | 0 refills | Status: AC

## 2022-03-20 MED ORDER — BISMUTH SUBSALICYLATE 262 MG PO CHEW: 524.0000 mg | CHEWABLE_TABLET | Freq: Four times a day (QID) | ORAL | 0 refills | Status: AC

## 2022-03-20 MED ORDER — DOXYCYCLINE HYCLATE 50 MG PO CAPS: 100.0000 mg | ORAL_CAPSULE | Freq: Two times a day (BID) | ORAL | 0 refills | Status: AC

## 2022-03-21 ENCOUNTER — Other Ambulatory Visit: Payer: Self-pay

## 2022-03-21 DIAGNOSIS — B9681 Helicobacter pylori [H. pylori] as the cause of diseases classified elsewhere: Secondary | ICD-10-CM

## 2022-03-21 NOTE — Progress Notes (Signed)
H

## 2023-01-11 ENCOUNTER — Encounter (HOSPITAL_COMMUNITY): Payer: Self-pay

## 2023-01-11 ENCOUNTER — Emergency Department (HOSPITAL_COMMUNITY): Payer: Medicaid Other

## 2023-01-11 ENCOUNTER — Emergency Department (HOSPITAL_COMMUNITY)
Admission: EM | Admit: 2023-01-11 | Discharge: 2023-01-11 | Disposition: A | Discharge status: Home / Self Care | Payer: Medicaid Other | Attending: Emergency Medicine | Admitting: Emergency Medicine

## 2023-01-11 ENCOUNTER — Other Ambulatory Visit: Payer: Self-pay

## 2023-01-11 DIAGNOSIS — Z794 Long term (current) use of insulin: Secondary | ICD-10-CM | POA: Diagnosis not present

## 2023-01-11 DIAGNOSIS — S0101XA Laceration without foreign body of scalp, initial encounter: Secondary | ICD-10-CM | POA: Insufficient documentation

## 2023-01-11 DIAGNOSIS — Z7984 Long term (current) use of oral hypoglycemic drugs: Secondary | ICD-10-CM | POA: Insufficient documentation

## 2023-01-11 DIAGNOSIS — E1165 Type 2 diabetes mellitus with hyperglycemia: Secondary | ICD-10-CM | POA: Diagnosis not present

## 2023-01-11 DIAGNOSIS — E871 Hypo-osmolality and hyponatremia: Secondary | ICD-10-CM | POA: Insufficient documentation

## 2023-01-11 DIAGNOSIS — S0990XA Unspecified injury of head, initial encounter: Secondary | ICD-10-CM

## 2023-01-11 DIAGNOSIS — Z7982 Long term (current) use of aspirin: Secondary | ICD-10-CM | POA: Diagnosis not present

## 2023-01-11 DIAGNOSIS — S80211A Abrasion, right knee, initial encounter: Secondary | ICD-10-CM | POA: Insufficient documentation

## 2023-01-11 DIAGNOSIS — S80212A Abrasion, left knee, initial encounter: Secondary | ICD-10-CM | POA: Diagnosis not present

## 2023-01-11 DIAGNOSIS — R519 Headache, unspecified: Secondary | ICD-10-CM | POA: Diagnosis present

## 2023-01-11 LAB — BASIC METABOLIC PANEL
Anion gap: 8 (ref 5–15)
BUN: 14 mg/dL (ref 6–20)
CO2: 24 mmol/L (ref 22–32)
Calcium: 9 mg/dL (ref 8.9–10.3)
Chloride: 97 mmol/L — ABNORMAL LOW (ref 98–111)
Creatinine, Ser: 0.87 mg/dL (ref 0.61–1.24)
GFR, Estimated: >60 mL/min (ref >60)
Glucose, Bld: 387 mg/dL — ABNORMAL HIGH (ref 70–99)
Potassium: 4.7 mmol/L (ref 3.5–5.1)
Sodium: 129 mmol/L — ABNORMAL LOW (ref 135–145)

## 2023-01-11 LAB — CBC WITH DIFFERENTIAL/PLATELET
Abs Immature Granulocytes: 0.01 10*3/uL (ref 0.00–0.07)
Basophils Absolute: 0 10*3/uL (ref 0.0–0.1)
Basophils Relative: 0 %
Eosinophils Absolute: 0.1 10*3/uL (ref 0.0–0.5)
Eosinophils Relative: 1 %
HCT: 38.5 % — ABNORMAL LOW (ref 39.0–52.0)
Hemoglobin: 13.5 g/dL (ref 13.0–17.0)
Immature Granulocytes: 0 %
Lymphocytes Relative: 27 %
Lymphs Abs: 2 10*3/uL (ref 0.7–4.0)
MCH: 29.5 pg (ref 26.0–34.0)
MCHC: 35.1 g/dL (ref 30.0–36.0)
MCV: 84.2 fL (ref 80.0–100.0)
Monocytes Absolute: 0.4 10*3/uL (ref 0.1–1.0)
Monocytes Relative: 5 %
Neutro Abs: 4.8 10*3/uL (ref 1.7–7.7)
Neutrophils Relative %: 67 %
Platelets: 195 10*3/uL (ref 150–400)
RBC: 4.57 MIL/uL (ref 4.22–5.81)
RDW: 11.7 % (ref 11.5–15.5)
WBC: 7.3 10*3/uL (ref 4.0–10.5)
nRBC: 0 % (ref 0.0–0.2)

## 2023-01-11 LAB — CBG MONITORING, ED: Glucose-Capillary: 346 mg/dL — ABNORMAL HIGH (ref 70–99)

## 2023-01-11 MED ORDER — SODIUM CHLORIDE 0.9 % IV BOLUS
1000.0000 mL | Freq: Once | INTRAVENOUS | Status: AC
  Administered 2023-01-11: 1000 mL via INTRAVENOUS

## 2023-01-11 MED ORDER — OXYCODONE-ACETAMINOPHEN 5-325 MG PO TABS
1.0000 | ORAL_TABLET | Freq: Once | ORAL | Status: AC
  Administered 2023-01-11: 1 via ORAL
  Filled 2023-01-11: qty 1

## 2023-01-11 MED ORDER — BACITRACIN-NEOMYCIN-POLYMYXIN OINTMENT TUBE
TOPICAL_OINTMENT | Freq: Once | CUTANEOUS | Status: AC
  Filled 2023-01-11 (×2): qty 14

## 2023-01-11 MED ORDER — TETANUS-DIPHTH-ACELL PERTUSSIS 5-2.5-18.5 LF-MCG/0.5 IM SUSY: 0.5000 mL | PREFILLED_SYRINGE | Freq: Once | INTRAMUSCULAR | Status: DC

## 2023-01-11 NOTE — Discharge Instructions (Addendum)
Keep staples dry. Please refrain from swimming or bathing. Area can be gently cleaned with mild soap and water after 24 hours. Please return to local urgent care in  5-7 days for staple removal. Failure to remove staples in timely manner can result in infection.  Your glucoses were elevated this evening, we had a discussion about giving medications or letting you go home and take your home glucose medicines.  Please take your medications and follow-up with your doctor for further management of your elevated blood sugars.
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
## 2023-01-16 ENCOUNTER — Ambulatory Visit (HOSPITAL_COMMUNITY)
Admission: EM | Admit: 2023-01-16 | Discharge: 2023-01-16 | Disposition: A | Discharge status: Home / Self Care | Payer: Medicaid Other | Attending: Family Medicine | Admitting: Family Medicine

## 2023-01-16 ENCOUNTER — Other Ambulatory Visit: Payer: Self-pay

## 2023-01-16 ENCOUNTER — Encounter (HOSPITAL_COMMUNITY): Payer: Self-pay | Admitting: Emergency Medicine

## 2023-01-16 DIAGNOSIS — Z4802 Encounter for removal of sutures: Secondary | ICD-10-CM | POA: Diagnosis not present

## 2023-01-16 NOTE — ED Triage Notes (Signed)
Pt requesting to have head staples remove. Staples place on the ED on 01/11/2023 to be assess by UC provider.

## 2023-01-17 NOTE — ED Provider Notes (Addendum)
  [facility]   [id number] 01/16/23 Arrival Time: 1940  ASSESSMENT & PLAN:  1. Encounter for staple removal    Staples removed by RN. Wound has healed. OTC ibuprofen if needed.   Reviewed expectations re: course of current medical issues. Questions answered. Outlined signs and symptoms indicating need for more acute intervention. Patient verbalized understanding. After Visit Summary given.   SUBJECTIVE:  Gregory Campos is a 37 y.o. male who is requesting to have head staples remove. Staples place on the ED on 01/11/2023. No concerns.   OBJECTIVE:  Vitals:   01/16/23 1947  BP: 115/74  Pulse: 100  Resp: 18  Temp: 98 F (36.7 C)  TempSrc: Oral  SpO2: 98%    General appearance: alert; no distress Skin: linear laceration of post scalp with 3 staples; has healed; no signs of infection; no bleeding Psychological: alert and cooperative; normal mood and affect    No Known Allergies  Past Medical History:  Diagnosis Date   Depression    Diabetes mellitus without complication (HCC)    age 33 yo at diagnosis   ED (erectile dysfunction)    GERD (gastroesophageal reflux disease)    Hepatitis B    Hyperlipidemia    Neuropathy    Social History   Socioeconomic History   Marital status: Married    Spouse name: Not on file   Number of children: Not on file   Years of education: Not on file   Highest education level: Not on file  Occupational History   Not on file  Tobacco Use   Smoking status: Never   Smokeless tobacco: Former    Types: Associate Professor Use: Never used  Substance and Sexual Activity   Alcohol use: Not Currently    Alcohol/week: 24.0 standard drinks of alcohol    Types: 24 Cans of beer per week    Comment: Reports last drink was a few months ago   Drug use: No   Sexual activity: Yes  Other Topics Concern   Not on file  Social History Narrative   Married, have 2 children, originally from [place], came from [place] as a  refugee, been in [place] since 2008   Social Determinants of Health   Financial Resource Strain: Not on file  Food Insecurity: Not on file  Transportation Needs: Not on file  Physical Activity: Not on file  Stress: Not on file  Social Connections: Not on file          [place], MD 01/17/23 [id number]    Mardella Layman, MD 01/17/23 [phone]

## 2023-03-12 ENCOUNTER — Ambulatory Visit: Payer: Self-pay | Admitting: Gastroenterology

## 2023-03-12 ENCOUNTER — Encounter: Payer: Self-pay | Admitting: Gastroenterology

## 2023-03-12 VITALS — BP 120/62 | HR 78 | Ht 67.0 in | Wt 124.5 lb

## 2023-03-12 DIAGNOSIS — R197 Diarrhea, unspecified: Secondary | ICD-10-CM

## 2023-03-12 DIAGNOSIS — B181 Chronic viral hepatitis B without delta-agent: Secondary | ICD-10-CM

## 2023-03-12 MED ORDER — TENOFOVIR ALAFENAMIDE FUMARATE 25 MG PO TABS: 1.0000 | ORAL_TABLET | Freq: Every day | ORAL | 4 refills | Status: AC

## 2023-03-12 NOTE — Progress Notes (Signed)
01/30/2022 Gregory Campos [id number] Dec 29, 1985   Referring provider: Diamantina Providence, FNP Primary GI doctor: Dr. Chales Abrahams   ASSESSMENT AND PLAN:   Uncontrolled type 2 diabetes mellitus with hyperglycemia (HCC) Get better control of sugars, explained would not be able to do colon/EGD if sugars are too elevated, follow up with PCP.  May benefit from GES after EGD    Loss of weight Normal CT AB and pelvis Possible from depression/anxiety, being treated by PCP Improved after ABX, possible SIBO treated Will get endoscopic evaluation May consider GES pending results   Diarrhea with history of rectal abscess Will check inflammatory labs, will proceed with colonoscopy for evaluation.  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  Stopped since he ran out of medication.  Brother died due to [facility] in [place] at age 11   Patient Care Team: [name], MD as PCP - General (Internal Medicine)   HISTORY OF PRESENT ILLNESS: 37 y.o. 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  Reviewing patient's chart appears he has had chronic fatigue for about 2 years with work-up by PCP.   Patient's had multiple ER visits for different complaints, has history of depression/anxiety and has had multiple medications for this. 01/11/2022 ER visit for abdominal pain with nausea  vomiting and diarrhea. Hemoglobin 11.8, has chronic anemia with normal MCV. ALT 52, AST 31, glucose 253, sodium 130, potassium normal, lipase normal CT abdomen pelvis with contrast in the ER showed fluid in the left colon and rectum consistent with provided history of diarrheal illness unchanged hepatic hemangioma, unremarkable gallbladder, no biliary dilatation, unremarkable pancreas and spleen no evidence of colitis or obstruction. 10/05/2021  unremarkable CT abdomen pelvis with contrast  05/31/2021  CT abdomen pelvis that showed perirectal abscess, hepatic steatosis.     He has been having a lot of gas, bloating, flatulance for 3-4 years.  Then last 2 years he has had depression due to wife leaving him.  Last 6 months has had decreased appetite with weight loss.  He had diarrhea, watery, can have 20 times a day, will have fecal incontinence and will have nocturnal symptoms with fecal incontinence. Was wearing a diaper.  He states he tested positive for H pylori with blood with PCP, started on Augmentin, clarithromycin, and flagyl with prilosec.  After he completed this he feels that his diarrhea has improved and his bloating improved. His weight and energy is up.  He however continues to have BM every 4-5 days.  He was having GERD, hiccups, and was having nausea with vomiting every day for 2 months, started in morning, not associated with food.  Denies melena or hematochezia. Does have rectal pain and occ feels that the abscess is back.  Intermittent dysphagia, has improved.  He still gets full quickly, no nausea, vomtiing with eating.  Does not know family history, grew up in [place].         Wt Readings from Last 3  Encounters:  01/30/22 126 lb (57.2 kg)  10/05/21 133 lb (60.3 kg)  05/31/21 150 lb (68 kg)          Current Medications:    Current Outpatient Medications (Endocrine & Metabolic):    metFORMIN (GLUCOPHAGE) 1000 MG tablet, Take 1,000 mg by mouth 2 (two)  times daily.   insulin glargine (LANTUS SOLOSTAR) 100 UNIT/ML Solostar Pen, Inject 14 Units into the skin daily. (Patient not taking: Reported on 01/11/2022)   Current Outpatient Medications (Cardiovascular):    ramipril (ALTACE) 5 MG capsule, Take 5 mg by mouth daily. (Patient not taking: Reported on 01/30/2022)   rosuvastatin (CRESTOR) 5 MG tablet, Take 5 mg by mouth daily. (Patient not taking: Reported on 01/30/2022)     Current Outpatient Medications (Analgesics):    acetaminophen (TYLENOL) 500 MG tablet, Take 2 tablets (1,000 mg total) by mouth every 6 (six) hours as needed for mild pain.   aspirin EC 81 MG tablet, Take 81 mg by mouth daily. Swallow whole. (Patient not taking: Reported on 01/30/2022)   oxyCODONE (OXY IR/ROXICODONE) 5 MG immediate release tablet, Take 1 tablet (5 mg total) by mouth every 6 (six) hours as needed for severe pain. (Patient not taking: Reported on 01/11/2022)   Current Outpatient Medications (Hematological):    cyanocobalamin (,VITAMIN B-12,) 1000 MCG/ML injection, Inject 1,000 mcg into the muscle every 30 (thirty) days.   Current Outpatient Medications (Other):    blood glucose meter kit and supplies, Dispense based on patient and insurance preference. Use up to four times daily as directed. (FOR ICD-10 E10.9, E11.9).   dicyclomine (BENTYL) 20 MG tablet, Take 1 tablet (20 mg total) by mouth 3 (three) times daily as needed for spasms.   famotidine (PEPCID) 20 MG tablet, Take 1 tablet (20 mg total) by mouth 2 (two) times daily.   gabapentin (NEURONTIN) 300 MG capsule, Take 300 mg by mouth 3 (three) times daily.   Insulin Pen Needle 32G X 4 MM MISC, Use 1 box as needed   loperamide (IMODIUM) 2 MG capsule, Take 1 capsule (2 mg total) by mouth 4 (four) times daily as needed for diarrhea or loose stools.   amoxicillin-clavulanate (AUGMENTIN) 875-125 MG tablet, Take 1 tablet by mouth every 12 (twelve) hours. (Patient not taking: Reported on 01/11/2022)   clarithromycin  (BIAXIN) 500 MG tablet, Take 500 mg by mouth 2 (two) times daily. (Patient not taking: Reported on 01/30/2022)   megestrol (MEGACE) 40 MG tablet, Take 40 mg by mouth 2 (two) times daily. (Patient not taking: Reported on 01/30/2022)   metroNIDAZOLE (FLAGYL) 500 MG tablet, Take 500 mg by mouth every 8 (eight) hours. (Patient not taking: Reported on 01/30/2022)   omeprazole (PRILOSEC) 20 MG capsule, Take 20 mg by mouth every 12 (twelve) hours. (Patient not taking: Reported on 01/30/2022)   ondansetron (ZOFRAN) 4 MG tablet, Take 1 tablet (4 mg total) by mouth every 6 (six) hours. (Patient not taking: Reported on 01/11/2022)   ondansetron (ZOFRAN-ODT) 4 MG disintegrating tablet, Take 1 tablet (4 mg total) by mouth every 8 (eight) hours as needed for nausea or vomiting. (Patient not taking: Reported on 01/30/2022)   Polyethylene Glycol 3350 (PEG 3350) 17 GM/SCOOP POWD, Take 17 g by mouth in the morning and at bedtime. (Patient not taking: Reported on 01/11/2022)   sucralfate (CARAFATE) 1 g tablet, Take 1 g by mouth 4 (four) times daily. (Patient not taking: Reported on 01/11/2022)   tamsulosin (FLOMAX) 0.4 MG CAPS capsule, Take 1 capsule (0.4 mg  total) by mouth daily. (Patient not taking: Reported on 01/11/2022)   traZODone (DESYREL) 50 MG tablet, Take 50 mg by mouth at bedtime. (Patient not taking: Reported on 01/30/2022)   Medical History:         Past Medical History:  Diagnosis Date   Depression     Diabetes mellitus without complication (HCC)      age 42 yo at diagnosis   ED (erectile dysfunction)     GERD (gastroesophageal reflux disease)     Hepatitis B     Hyperlipidemia     Neuropathy      Allergies: No Known Allergies    Surgical History:  He  has a past surgical history that includes Incision and drainage perirectal abscess (N/A, 05/31/2021). Family History:  His family history is not on file. Social History:   reports that he has never smoked. His smokeless tobacco use includes chew. He  reports that he does not currently use alcohol after a past usage of about 24.0 standard drinks of alcohol per week. He reports that he does not use drugs.   REVIEW OF SYSTEMS  : All other systems reviewed and negative except where noted in the History of Present Illness.     PHYSICAL EXAM: BP (!) 80/62   Pulse 77   Ht 5\' 7"  (1.702 m)   Wt 126 lb (57.2 kg)   SpO2 97%   BMI 19.73 kg/m  General:   Pleasant, well developed male in no acute distress Head:   Normocephalic and atraumatic. Eyes:  sclerae anicteric,conjunctive pink  Heart:   regular rate and rhythm Pulm:  Clear anteriorly; no wheezing Abdomen:   Soft, Flat AB, Active bowel sounds. mild tenderness in the epigastrium. Without guarding and Without rebound, No organomegaly appreciated. Rectal: declines Extremities:  Without edema, tattoos down arms Msk: Symmetrical without gross deformities. Peripheral pulses intact.  Neurologic:  Alert and  oriented x4;  No focal deficits.  Skin:   Dry and intact without significant lesions or rashes. Psychiatric:  Cooperative. Normal mood and affect.       Doree Albee, PA-C   Attending physician's note   I have taken history, reviewed the chart and examined the patient. I performed a substantive portion of this encounter, including complete performance of at least one of the key components, in conjunction with the APP. I agree with the Advanced Practitioner's note, impression and recommendations.   Unfortunately patient has lost Medicaid and will try to get it reinstated.  Diarrhea (d/t metformin). Neg colonoscopy with random biopsies 03/06/2022.  Next colonoscopy due in 10 years. Chronic hep B carrier (was on tenofoir) per pt x 2 yrs HP gastritis on EGD 02/2022 treated with triple drug therapy. Unfortunately brother (age 106) just died d/t HCC (was carrier) at [facility] Expired medicaid  Plan: -Resume Tenofovir 25 mg po every day #90 4RF -CBC, CMP, AFP, HBV viral load, Acute hep  panel, HBeAg, anti HBsAb, HIV -ID consultation -[place] abdo -Stool for HP antigen to ensure eradication -Can use imodium AD prn  -FU in 12 weeks.   Edman Circle, MD [facility] [phone]

## 2023-03-12 NOTE — Patient Instructions (Signed)
_______________________________________________________  If your blood pressure at your visit was 140/90 or greater, please contact your primary care physician to follow up on this.  _______________________________________________________  If you are age 37 or older, your body mass index should be between 23-30. Your Body mass index is 19.5 kg/m. If this is out of the aforementioned range listed, please consider follow up with your Primary Care Provider.  If you are age 42 or younger, your body mass index should be between 19-25. Your Body mass index is 19.5 kg/m. If this is out of the aformentioned range listed, please consider follow up with your Primary Care Provider.   ________________________________________________________  The  GI providers would like to encourage you to use [facility] to communicate with providers for non-urgent requests or questions.  Due to long hold times on the telephone, sending your provider a message by [facility] may be a faster and more efficient way to get a response.  Please allow 48 business hours for a response.  Please remember that this is for non-urgent requests.  _______________________________________________________  Resume tenofovir. You have been referred to ID. They should be calling you in 2 weeks hopefully.  Your provider has requested that you go to the basement level for lab work before leaving today. Press "B" on the elevator. The lab is located at the first door on the left as you exit the elevator.  You have been scheduled for an abdominal ultrasound at [facility] (1st floor of hospital) on 03-20-2023 at 8am. Please arrive 30 minutes prior to your appointment for registration. Make certain not to have anything to eat or drink midnight prior to your appointment. Should you need to reschedule your appointment, please contact radiology at [phone]. This test typically takes about 30 minutes to perform.  Please purchase the  following medications over the counter and take as directed: Imodium AD as needed  You have been scheduled for an appointment with Dr. Chales Abrahams on 06-07-2023 at 11:20am. Please arrive 10 minutes early for your appointment.  Thank you,  Dr. Lynann Bologna

## 2023-03-20 ENCOUNTER — Ambulatory Visit (HOSPITAL_COMMUNITY): Admission: RE | Admit: 2023-03-20 | Payer: Self-pay | Source: Ambulatory Visit

## 2023-04-04 ENCOUNTER — Telehealth: Payer: Self-pay | Admitting: Pharmacy Technician

## 2023-04-04 ENCOUNTER — Other Ambulatory Visit (HOSPITAL_COMMUNITY): Payer: Self-pay

## 2023-04-04 NOTE — Telephone Encounter (Signed)
[facility] Patient Advocate Encounter  Insurance verification completed.    The patient is uninsured and will need patient assistance for medication.  We can complete the application and will need to meet with the patient for signatures and income documentation.

## 2023-04-09 ENCOUNTER — Ambulatory Visit: Payer: Self-pay | Admitting: Internal Medicine

## 2023-06-07 ENCOUNTER — Ambulatory Visit: Payer: Self-pay | Admitting: Gastroenterology

## 2023-11-04 IMAGING — CT CT ABD-PELV W/ CM
2 of 4 series · 16 of 46 positions shown, 18 images · IV contrast (agent unspecified)
Comparison: 05/31/2021

CLINICAL DATA: Epigastric abdominal pain, vomiting, unintentional
weight loss

EXAM:
CT ABDOMEN AND PELVIS WITH CONTRAST
TECHNIQUE: Multidetector CT imaging of the abdomen and pelvis was performed
using the standard protocol following bolus administration of
intravenous contrast.

[Series 4: a/p w/ 5mm · axial · 0.80mm/px · z∈[+702,+1172]mm · 13 of 102 slices shown, 15 images]
[im 4/102  soft-tissue]
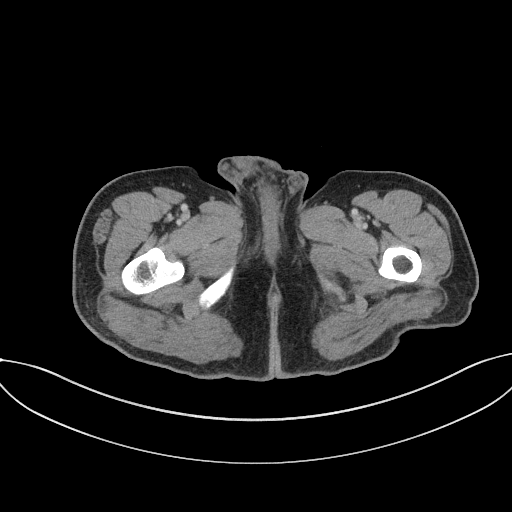
[im 4/102  bone]
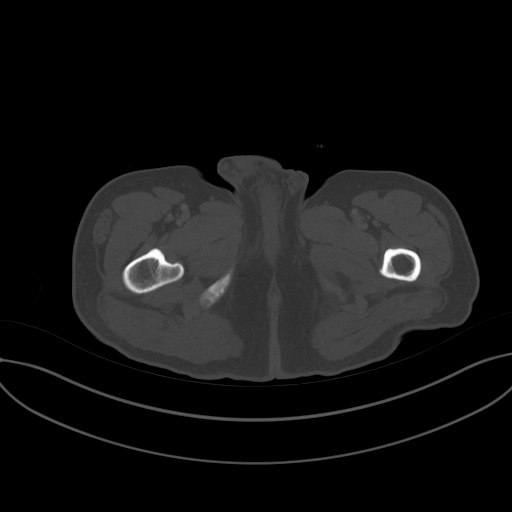
[im 12/102  soft-tissue]
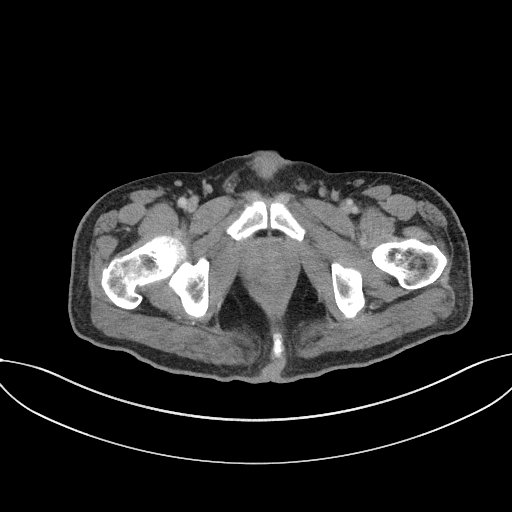
[im 20/102  soft-tissue]
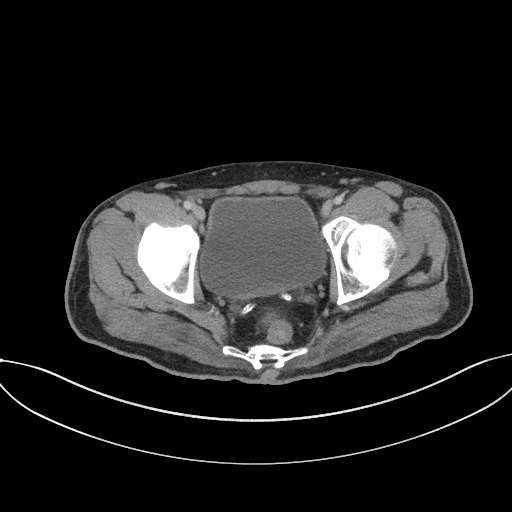
[im 28/102  soft-tissue]
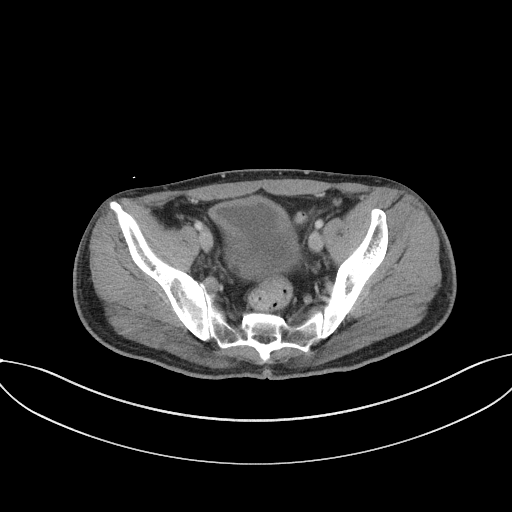
[im 35/102  soft-tissue]
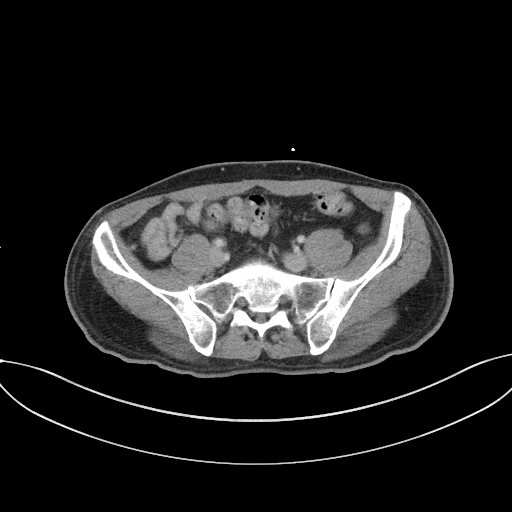
[im 43/102  soft-tissue]
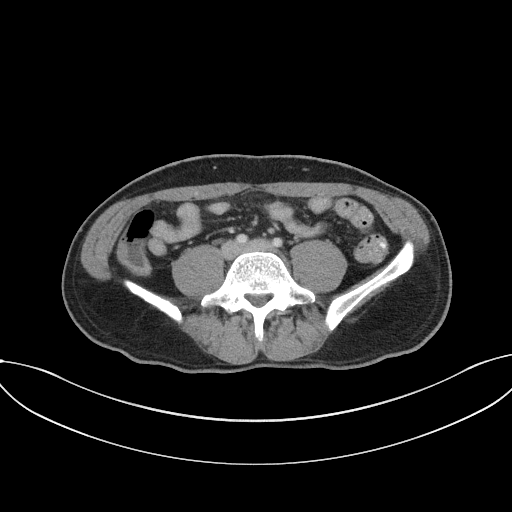
[im 51/102  soft-tissue]
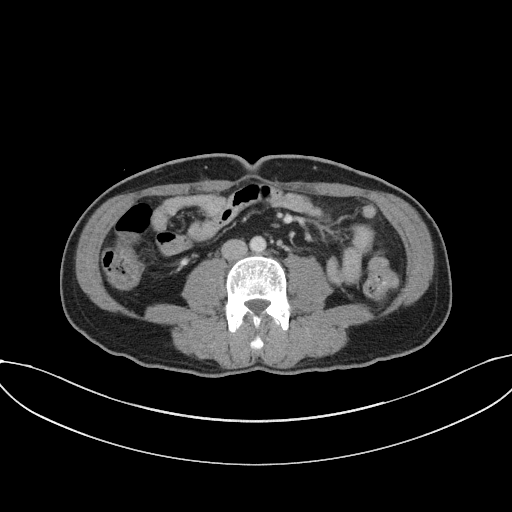
[im 59/102  soft-tissue]
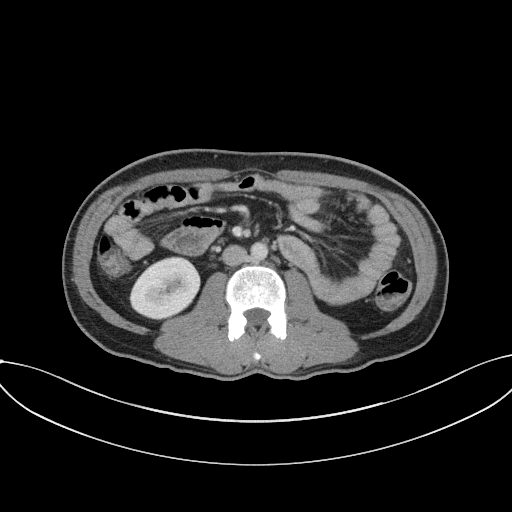
[im 67/102  soft-tissue]
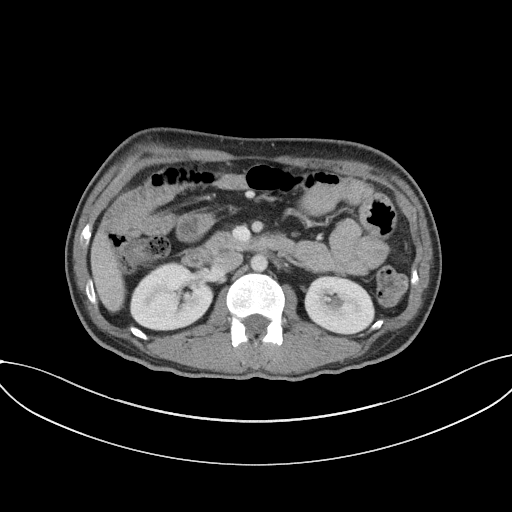
[im 67/102  bone]
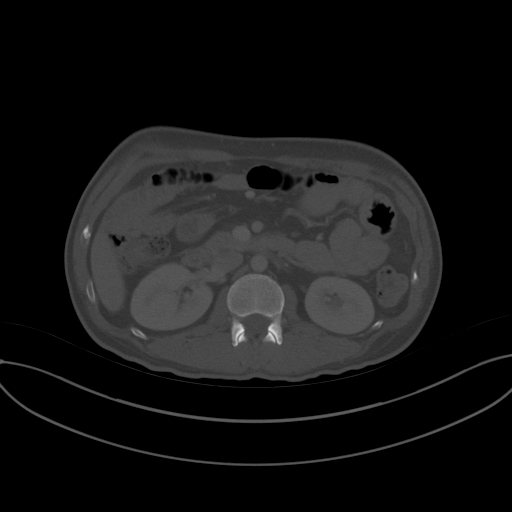
[im 74/102  soft-tissue]
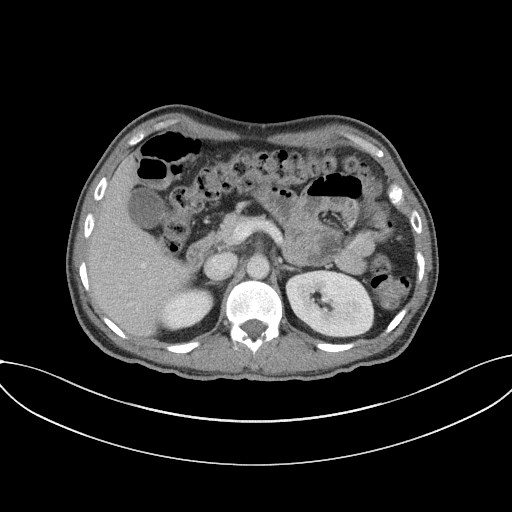
[im 82/102  soft-tissue]
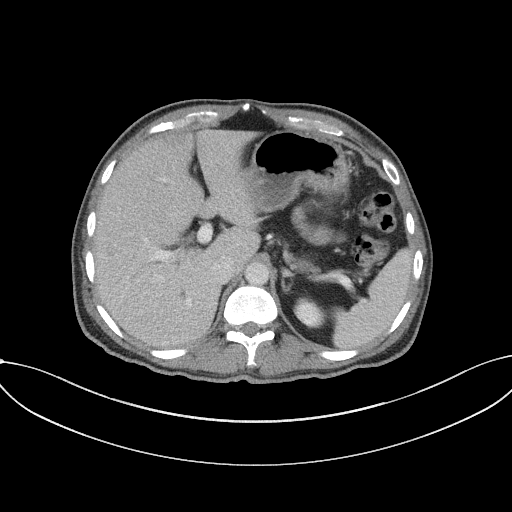
[im 90/102  soft-tissue]
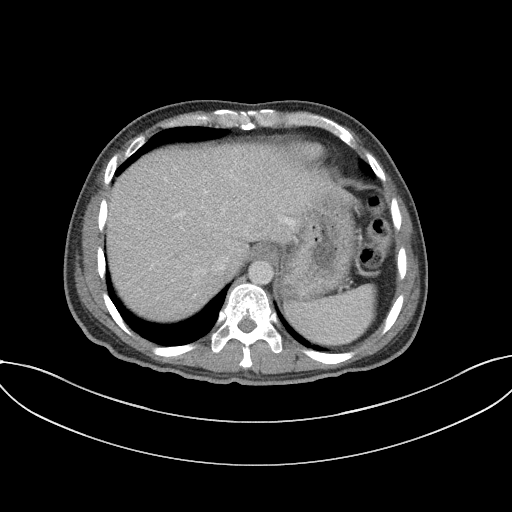
[im 98/102  soft-tissue]
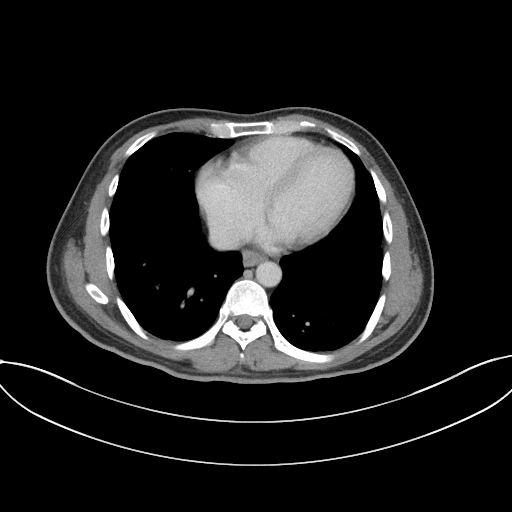

[Series 6: a/p w/ cor · coronal · 0.78mm/px · 3 of 117 slices shown]
[im 39/117  soft-tissue]
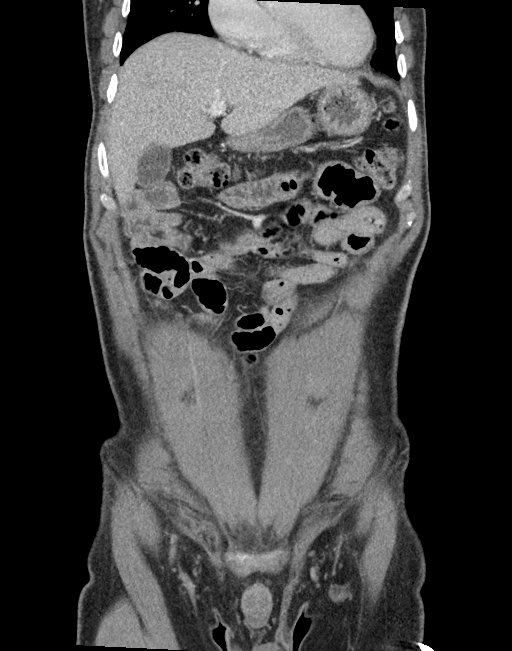
[im 52/117  soft-tissue]
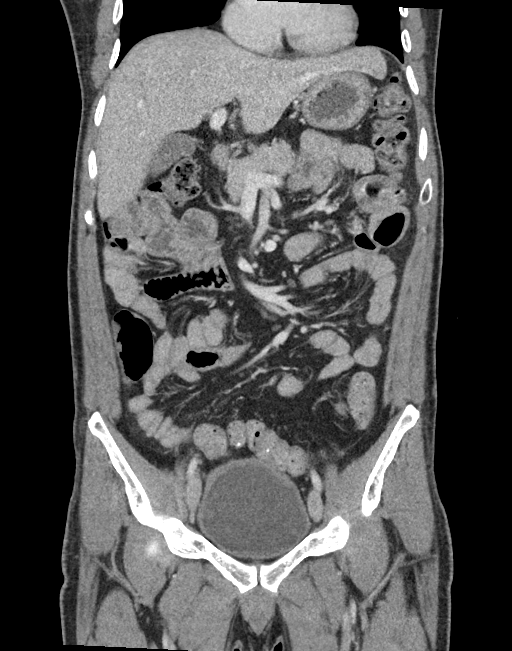
[im 65/117  soft-tissue]
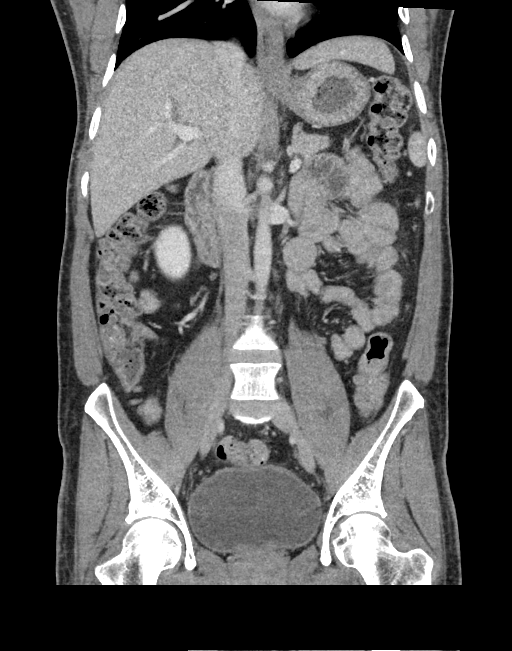

[16 of 46 positions shown; findings below may reference images not displayed]

RADIATION DOSE REDUCTION: This exam was performed according to the
departmental dose-optimization program which includes automated
exposure control, adjustment of the mA and/or kV according to
patient size and/or use of iterative reconstruction technique.

CONTRAST:  80mL OMNIPAQUE IOHEXOL 300 MG/ML  SOLN
FINDINGS: Lower chest: No acute abnormality.

Hepatobiliary: 13 mm hemangioma noted within the right hepatic lobe.
No additional intrahepatic enhancing mass. There is no intra or
extrahepatic biliary ductal dilation. Gallbladder unremarkable.

Pancreas: Unremarkable

Spleen: Unremarkable

Adrenals/Urinary Tract: Adrenal glands are unremarkable. Kidneys are
normal, without renal calculi, focal lesion, or hydronephrosis.
Bladder is unremarkable.

Stomach/Bowel: Stomach is within normal limits. Appendix appears
normal. No evidence of bowel wall thickening, distention, or
inflammatory changes.

Vascular/Lymphatic: No significant vascular findings are present. No
enlarged abdominal or pelvic lymph nodes.

Reproductive: Prostate is unremarkable.

Other: No abdominal wall hernia.  Rectum unremarkable.

Musculoskeletal: No acute bone abnormality. No lytic or blastic bone
lesion.
IMPRESSION: No acute intra-abdominal pathology identified. No definite
radiographic explanation for the patient's reported symptoms.
